# Patient Record
Sex: Male | Born: 1985 | Hispanic: Yes | Marital: Married
Health system: Southern US, Academic
[De-identification: ages and names within clinical notes are randomized; demographics above are authoritative.]

## PROBLEM LIST (undated history)

## (undated) ENCOUNTER — Ambulatory Visit

## (undated) ENCOUNTER — Telehealth

## (undated) ENCOUNTER — Ambulatory Visit: Attending: Pharmacist | Primary: Pharmacist

## (undated) ENCOUNTER — Encounter

## (undated) ENCOUNTER — Ambulatory Visit: Payer: PRIVATE HEALTH INSURANCE

## (undated) ENCOUNTER — Encounter: Attending: Physician Assistant | Primary: Physician Assistant

## (undated) ENCOUNTER — Telehealth
Attending: Student in an Organized Health Care Education/Training Program | Primary: Student in an Organized Health Care Education/Training Program

## (undated) ENCOUNTER — Ambulatory Visit: Payer: PRIVATE HEALTH INSURANCE | Attending: Physician Assistant | Primary: Physician Assistant

## (undated) ENCOUNTER — Encounter
Attending: Pharmacist Clinician (PhC)/ Clinical Pharmacy Specialist | Primary: Pharmacist Clinician (PhC)/ Clinical Pharmacy Specialist

## (undated) ENCOUNTER — Encounter
Attending: Student in an Organized Health Care Education/Training Program | Primary: Student in an Organized Health Care Education/Training Program

## (undated) ENCOUNTER — Encounter: Attending: Mental Health | Primary: Mental Health

## (undated) ENCOUNTER — Ambulatory Visit: Payer: Medicaid (Managed Care)

## (undated) ENCOUNTER — Encounter: Attending: Neurology | Primary: Neurology

## (undated) ENCOUNTER — Telehealth
Attending: Pharmacist Clinician (PhC)/ Clinical Pharmacy Specialist | Primary: Pharmacist Clinician (PhC)/ Clinical Pharmacy Specialist

## (undated) ENCOUNTER — Encounter: Attending: Obstetrics & Gynecology | Primary: Obstetrics & Gynecology

## (undated) ENCOUNTER — Telehealth: Attending: Mental Health | Primary: Mental Health

## (undated) ENCOUNTER — Ambulatory Visit
Payer: PRIVATE HEALTH INSURANCE | Attending: Pharmacist Clinician (PhC)/ Clinical Pharmacy Specialist | Primary: Pharmacist Clinician (PhC)/ Clinical Pharmacy Specialist

## (undated) ENCOUNTER — Ambulatory Visit
Payer: Medicaid (Managed Care) | Attending: Student in an Organized Health Care Education/Training Program | Primary: Student in an Organized Health Care Education/Training Program

## (undated) ENCOUNTER — Encounter: Attending: Pharmacist | Primary: Pharmacist

## (undated) HISTORY — PX: NO PAST SURGERIES: SHX2092

---

## 2005-06-04 ENCOUNTER — Emergency Department: Payer: Self-pay | Admitting: Emergency Medicine

## 2005-06-06 ENCOUNTER — Emergency Department: Payer: Self-pay | Admitting: Unknown Physician Specialty

## 2018-11-30 IMAGING — US US ABDOMEN LIMITED
1 series · 14 of 25 positions shown · non-contrast
Comparison: None.

CLINICAL DATA: Upper abdominal pain

EXAM:
ULTRASOUND ABDOMEN LIMITED RIGHT UPPER QUADRANT

[Series 1: us abdomen limited · 0.30mm/px · 14 of 39 slices shown]
[im 1/39]
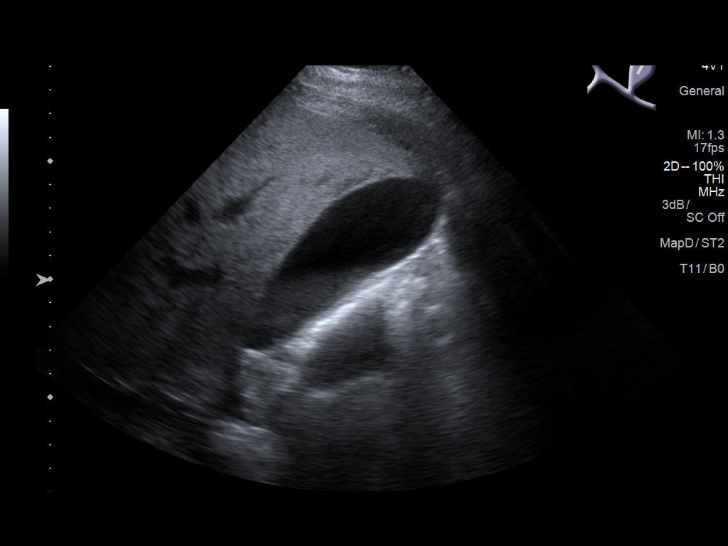
[im 4/39]
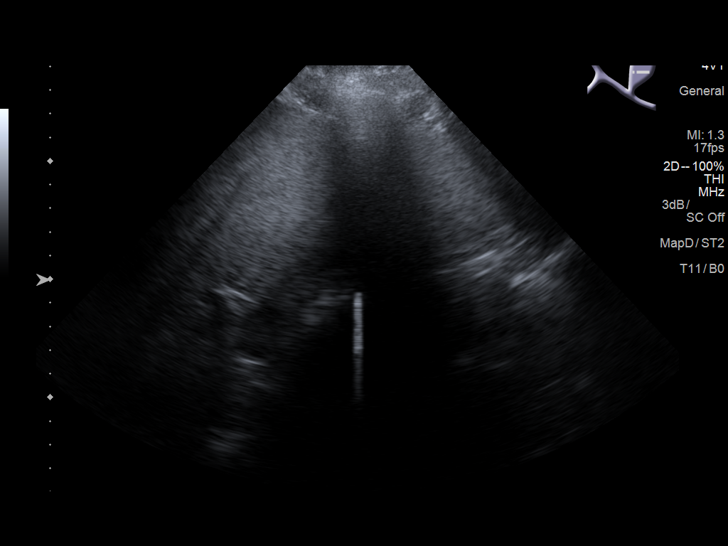
[im 7/39]
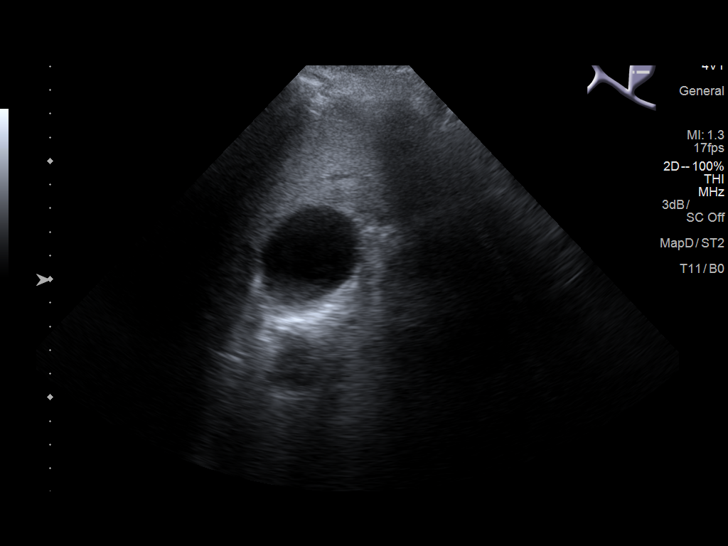
[im 10/39]
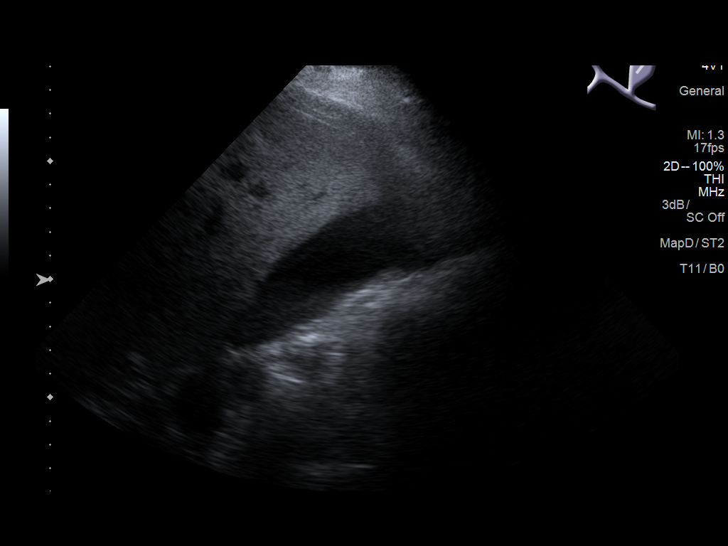
[im 13/39]
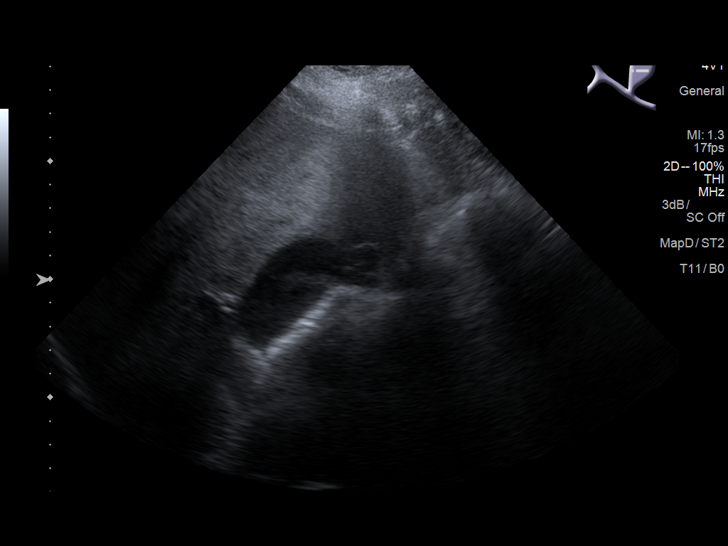
[im 15/39]
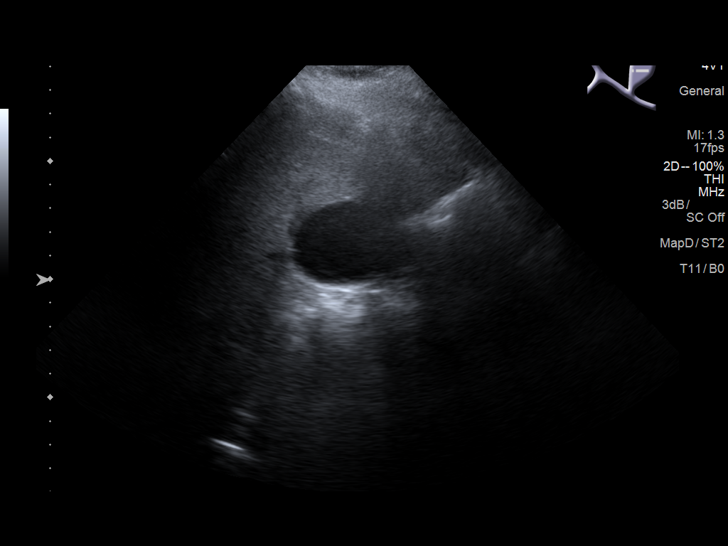
[im 18/39]
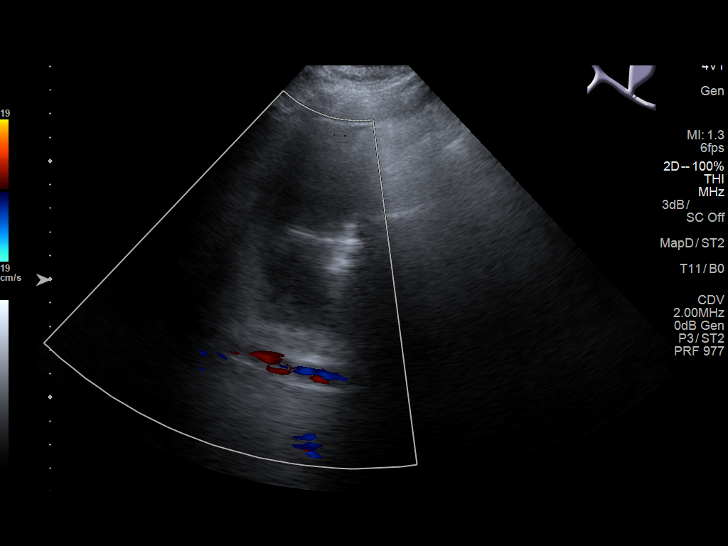
[im 21/39]
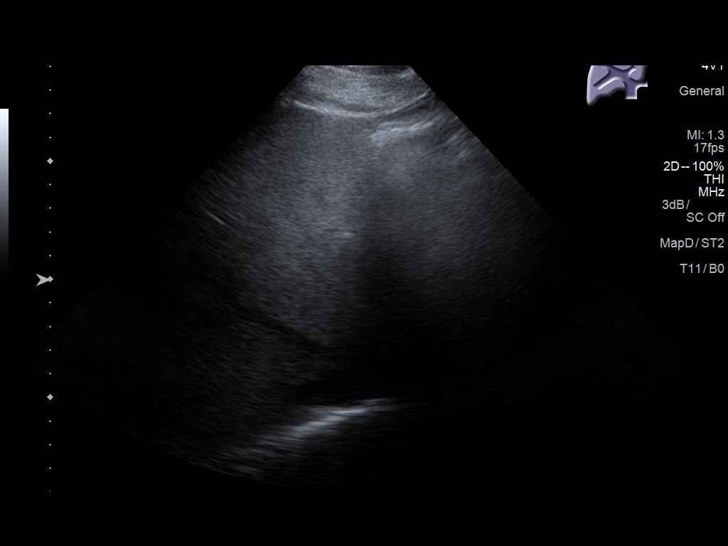
[im 24/39]
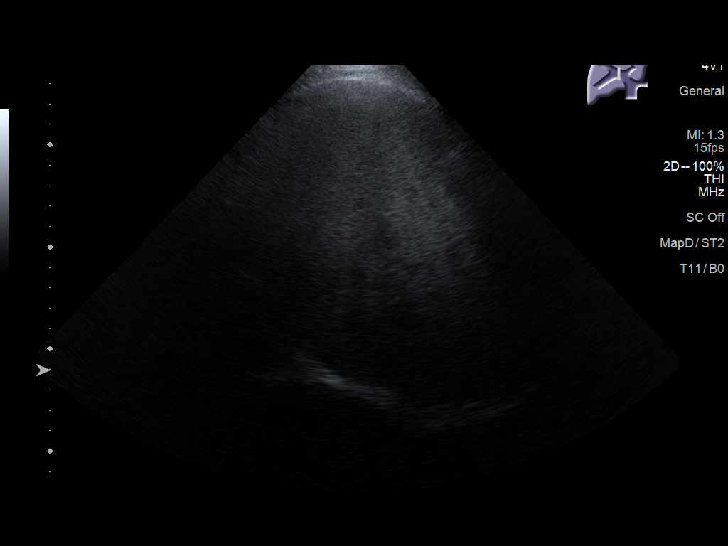
[im 26/39]
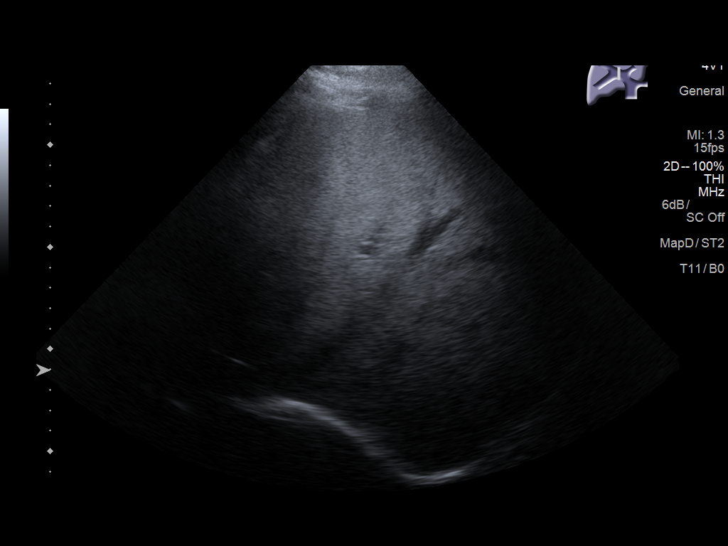
[im 29/39]
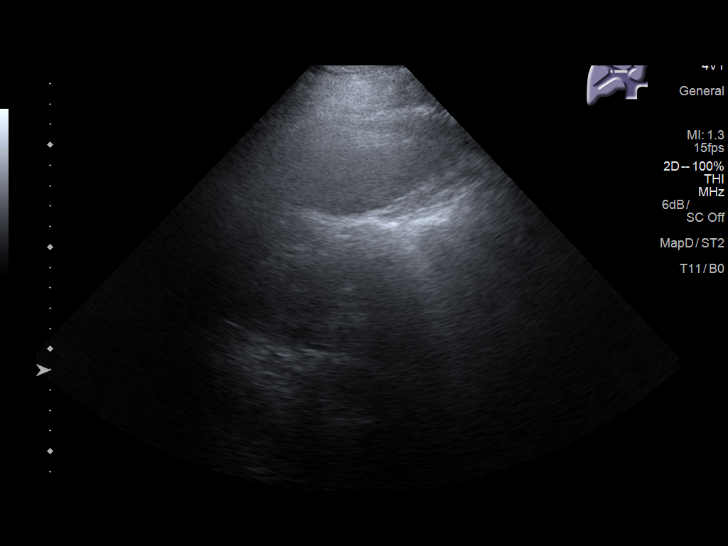
[im 32/39]
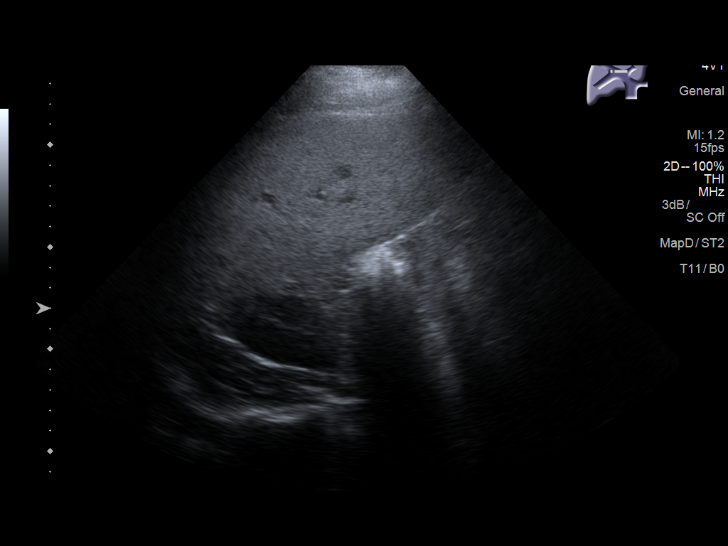
[im 35/39]
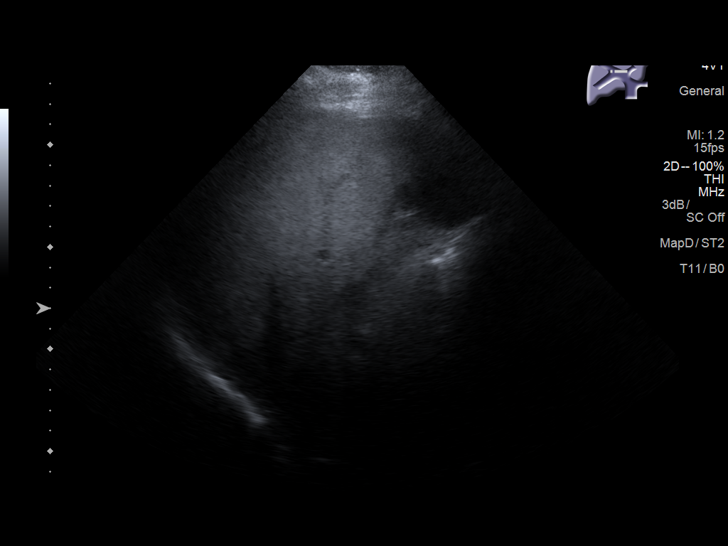
[im 39/39]
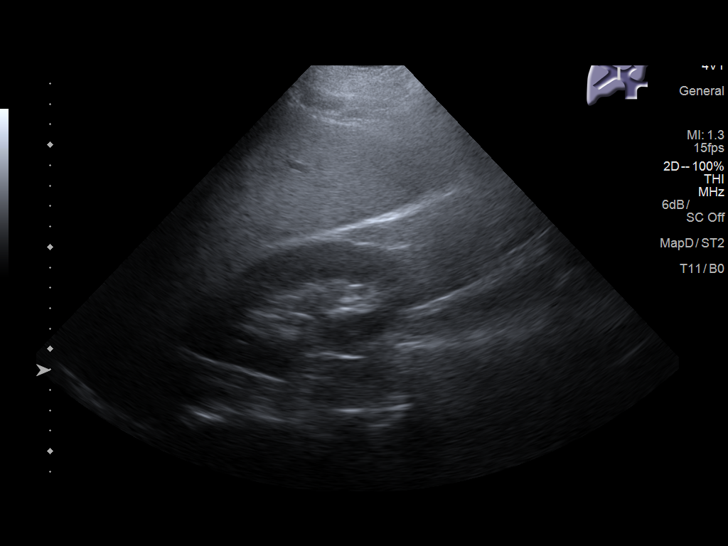

[14 of 25 positions shown; findings below may reference images not displayed]

FINDINGS: Gallbladder:

There is sludge noted in the gallbladder. No echogenic foci which
move and shadow as is expected with gallstones appreciable. There is
no gallbladder wall thickening or pericholecystic fluid. No
sonographic Murphy sign noted by sonographer.

Common bile duct:

Diameter: 6 mm. No intrahepatic or extrahepatic biliary duct
dilatation.

Liver:

No focal lesion identified. Liver echogenicity is increased
diffusely. Portal vein is patent on color Doppler imaging with
normal direction of blood flow towards the liver.
IMPRESSION: 1. Sludge in gallbladder. No gallstones, gallbladder wall
thickening, or pericholecystic fluid.

2. Increase in liver echogenicity, a finding indicative of hepatic
steatosis. While no focal liver lesions are evident on this study,
it must be cautioned that the sensitivity of ultrasound for
detection of focal liver lesions is diminished in this circumstance.

## 2019-01-16 ENCOUNTER — Encounter: Payer: Self-pay | Admitting: Emergency Medicine

## 2019-01-16 ENCOUNTER — Emergency Department
Admission: EM | Admit: 2019-01-16 | Discharge: 2019-01-16 | Disposition: A | Discharge status: Home / Self Care | Payer: 59 | Attending: Emergency Medicine | Admitting: Emergency Medicine

## 2019-01-16 ENCOUNTER — Emergency Department: Payer: 59

## 2019-01-16 DIAGNOSIS — R1010 Upper abdominal pain, unspecified: Secondary | ICD-10-CM

## 2019-01-16 DIAGNOSIS — R1011 Right upper quadrant pain: Secondary | ICD-10-CM | POA: Insufficient documentation

## 2019-01-16 LAB — CBC
HCT: 41.8 % (ref 39.0–52.0)
Hemoglobin: 14 g/dL (ref 13.0–17.0)
MCH: 30.3 pg (ref 26.0–34.0)
MCHC: 33.5 g/dL (ref 30.0–36.0)
MCV: 90.5 fL (ref 80.0–100.0)
PLATELETS: 325 10*3/uL (ref 150–400)
RBC: 4.62 MIL/uL (ref 4.22–5.81)
RDW: 11.9 % (ref 11.5–15.5)
WBC: 13.1 10*3/uL — ABNORMAL HIGH (ref 4.0–10.5)
nRBC: 0 % (ref 0.0–0.2)

## 2019-01-16 LAB — COMPREHENSIVE METABOLIC PANEL
ALT: 37 U/L (ref 0–44)
AST: 26 U/L (ref 15–41)
Albumin: 4.3 g/dL (ref 3.5–5.0)
Alkaline Phosphatase: 64 U/L (ref 38–126)
Anion gap: 7 (ref 5–15)
BUN: 11 mg/dL (ref 6–20)
CALCIUM: 8.7 mg/dL — AB (ref 8.9–10.3)
CO2: 25 mmol/L (ref 22–32)
Chloride: 108 mmol/L (ref 98–111)
Creatinine, Ser: 0.67 mg/dL (ref 0.61–1.24)
GFR calc Af Amer: >60 mL/min (ref >60)
GFR calc non Af Amer: >60 mL/min (ref >60)
Glucose, Bld: 92 mg/dL (ref 70–99)
Potassium: 3.7 mmol/L (ref 3.5–5.1)
Sodium: 140 mmol/L (ref 135–145)
Total Bilirubin: 1.1 mg/dL (ref 0.3–1.2)
Total Protein: 7.7 g/dL (ref 6.5–8.1)

## 2019-01-16 LAB — URINALYSIS, COMPLETE (UACMP) WITH MICROSCOPIC
Bacteria, UA: NONE SEEN
Bilirubin Urine: NEGATIVE
Glucose, UA: NEGATIVE mg/dL
Ketones, ur: NEGATIVE mg/dL
Leukocytes, UA: NEGATIVE
Nitrite: NEGATIVE
PH: 6 (ref 5.0–8.0)
Protein, ur: NEGATIVE mg/dL
SPECIFIC GRAVITY, URINE: 1.024 (ref 1.005–1.030)

## 2019-01-16 LAB — TROPONIN I: Troponin I: <0.03 ng/mL (ref <0.03)

## 2019-01-16 LAB — LIPASE, BLOOD: Lipase: 24 U/L (ref 11–51)

## 2019-01-16 IMAGING — CT CT ABD-PELV W/ CM
2 of 4 series · 16 of 46 positions shown, 18 images · IV contrast (APPLIED)
Comparison: Ultrasound right upper quadrant [DATE]

CLINICAL DATA: Abdominal pain, primarily right-sided

EXAM:
CT ABDOMEN AND PELVIS WITH CONTRAST
TECHNIQUE: Multidetector CT imaging of the abdomen and pelvis was performed
using the standard protocol following bolus administration of
intravenous contrast.
CONTRAST:  100mL [HA] IOPAMIDOL ([HA]) INJECTION 61%

[Series 2: routine abd/pel with · axial · 0.85mm/px · z∈[-212,+293]mm · 13 of 111 slices shown, 15 images]
[im 5/111  soft-tissue]
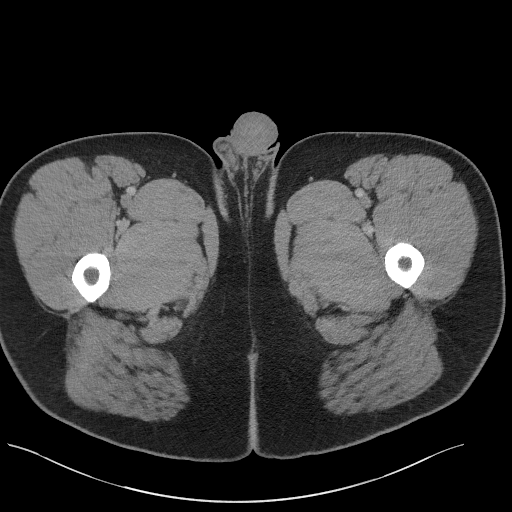
[im 5/111  bone]
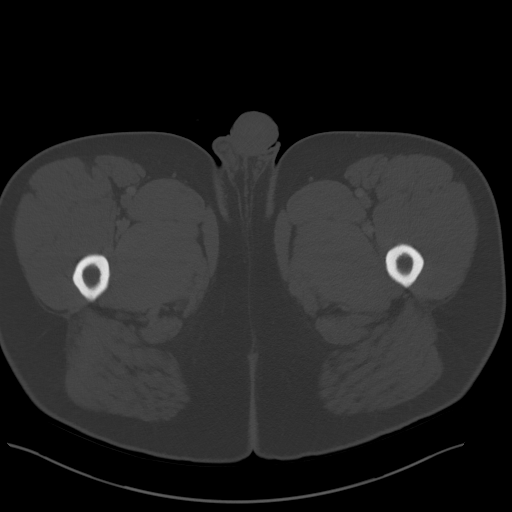
[im 15/111  soft-tissue]
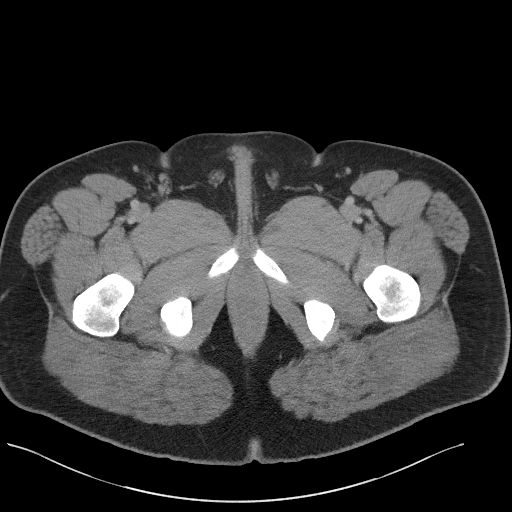
[im 24/111  soft-tissue]
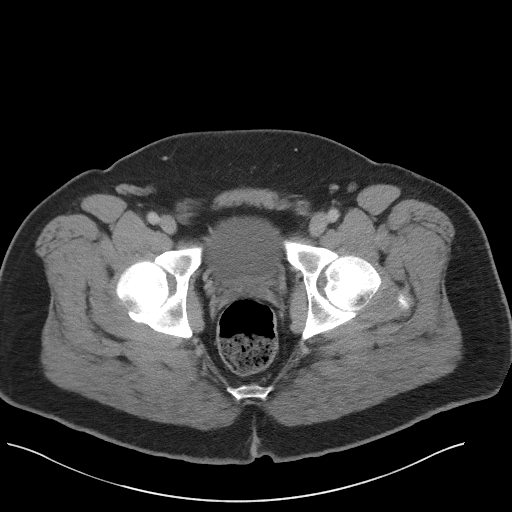
[im 29/111  soft-tissue]
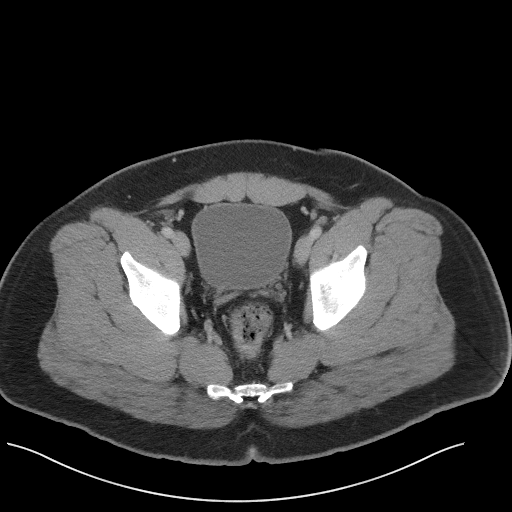
[im 39/111  soft-tissue]
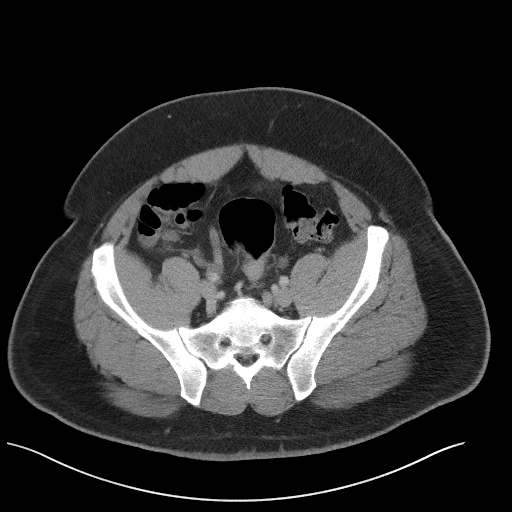
[im 48/111  soft-tissue]
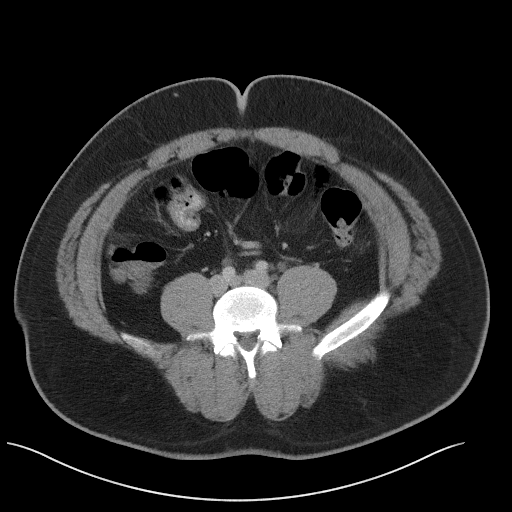
[im 58/111  soft-tissue]
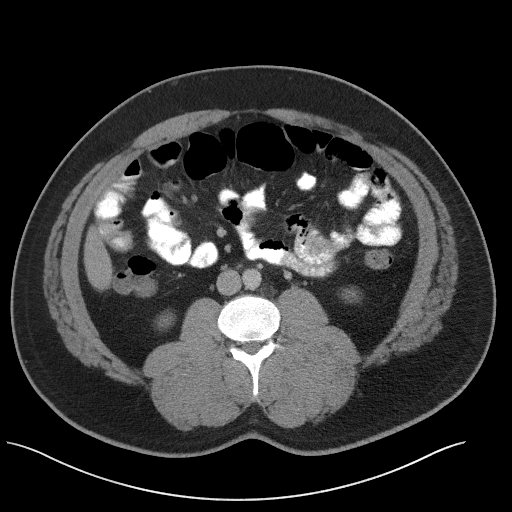
[im 63/111  soft-tissue]
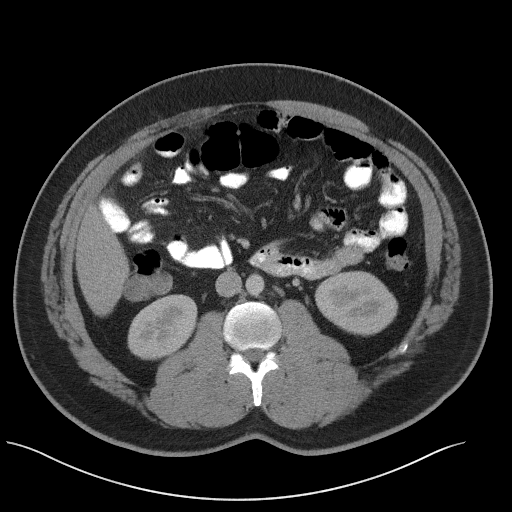
[im 72/111  soft-tissue]
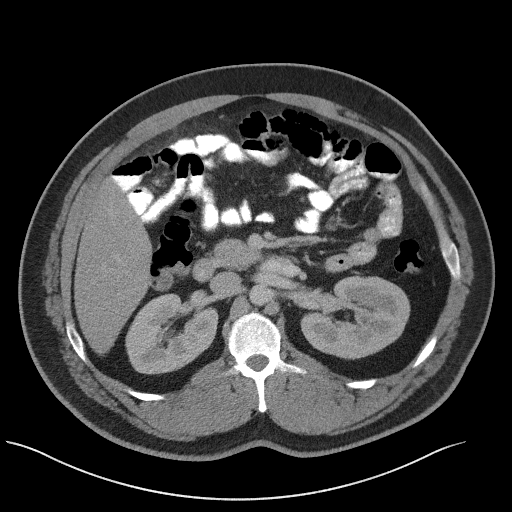
[im 72/111  bone]
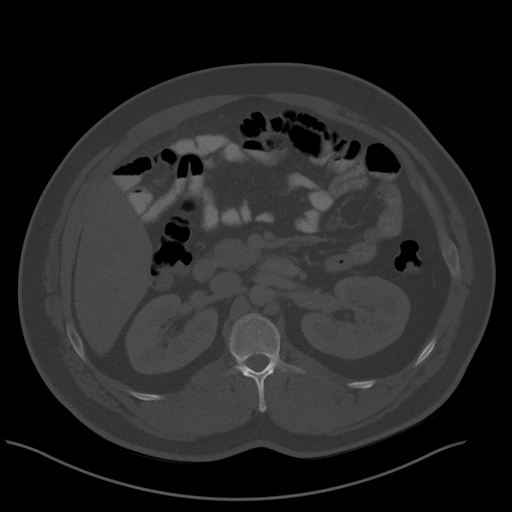
[im 82/111  soft-tissue]
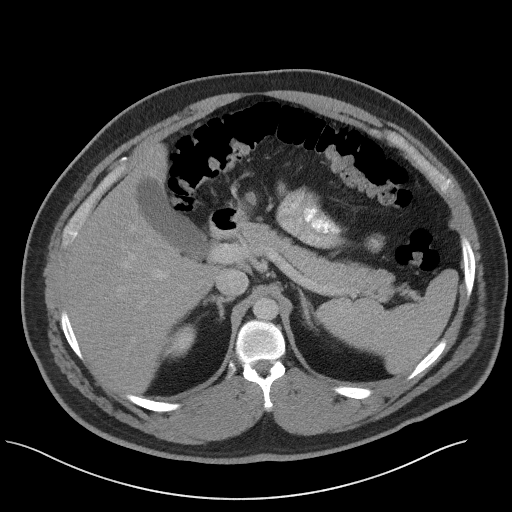
[im 87/111  soft-tissue]
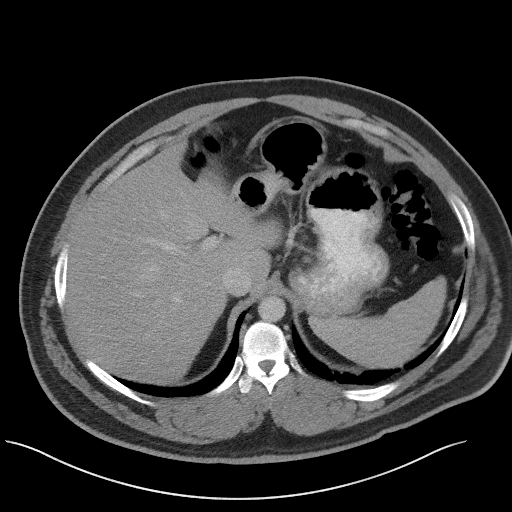
[im 96/111  soft-tissue]
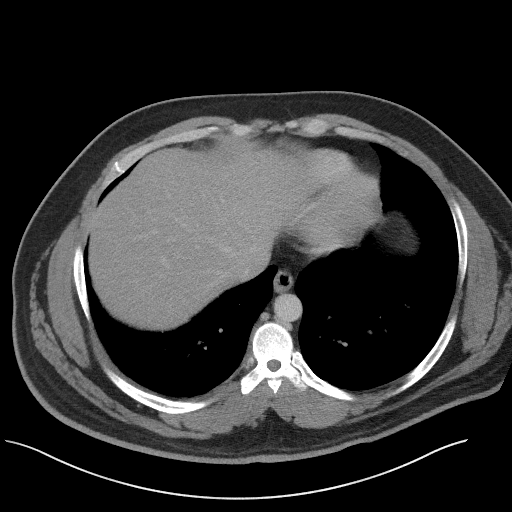
[im 106/111  soft-tissue]
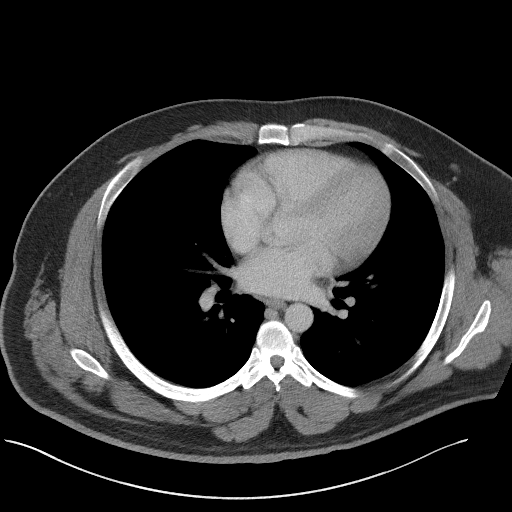

[Series 5: coronal st · coronal · 0.93mm/px · 3 of 106 slices shown]
[im 36/106  soft-tissue]
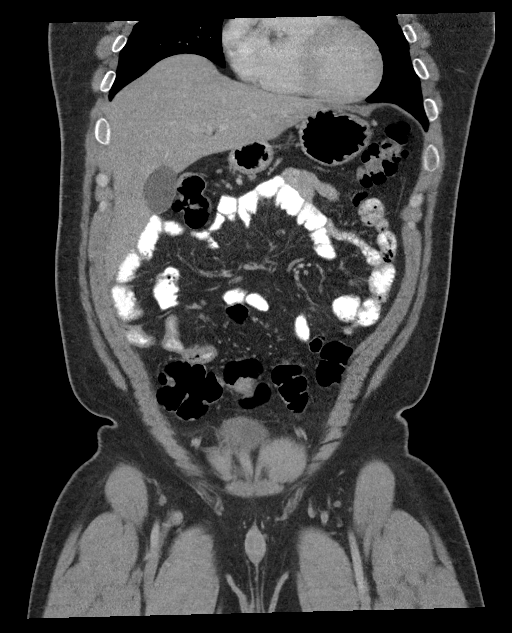
[im 47/106  soft-tissue]
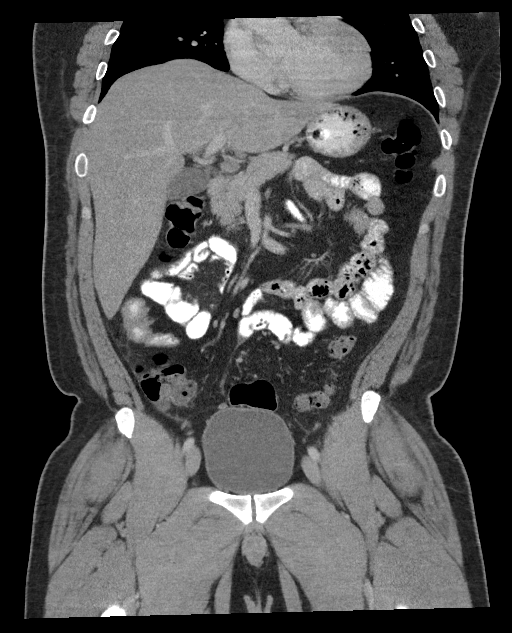
[im 59/106  soft-tissue]
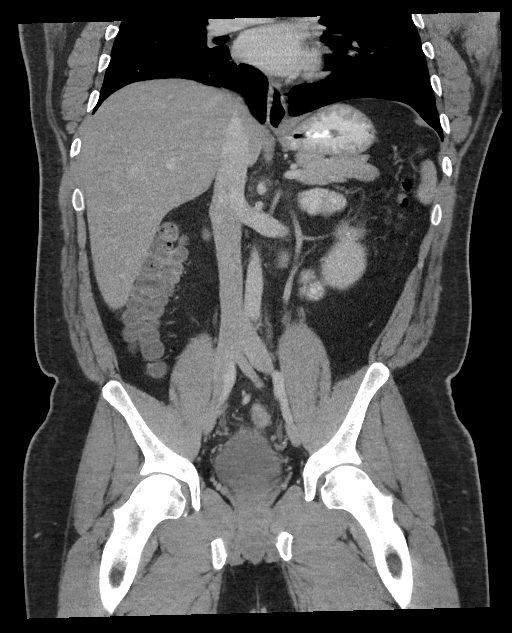

[16 of 46 positions shown; findings below may reference images not displayed]

FINDINGS: Lower chest: Lung bases are clear.

Hepatobiliary: There is hepatic steatosis. No focal liver lesions
are appreciable. Gallbladder wall is not appreciably thickened.
There is no biliary duct dilatation.

Pancreas: No pancreatic mass or inflammatory focus.

Spleen: No splenic lesions are evident.

Adrenals/Urinary Tract: Adrenals bilaterally appear unremarkable.
Kidneys bilaterally show no evident mass or hydronephrosis on either
side. There is no appreciable renal or ureteral calculus on either
side. Urinary bladder is midline with wall thickness within normal
limits.

Stomach/Bowel: There is no appreciable bowel wall or mesenteric
thickening. There is no evident bowel obstruction. There is no free
air or portal venous air.

Vascular/Lymphatic: There is no abdominal aortic aneurysm. No
vascular lesions are evident. There is no adenopathy by size
criteria in the abdomen or pelvis. There are subcentimeter inguinal
lymph nodes considered nonspecific. There are scattered
subcentimeter mesenteric lymph nodes, also regarded as nonspecific.

Reproductive: Prostate and seminal vesicles appear normal in size
and contour. No evident pelvic mass.

Other: Appendix appears normal. No evident abscess or ascites in the
abdomen or pelvis.

Musculoskeletal: There are no blastic or lytic bone lesions. There
is no intramuscular or abdominal wall lesion.
IMPRESSION: 1. No demonstrable bowel wall thickening or bowel obstruction. No
abscess in the abdomen or pelvis. Appendix appears normal.

2.  No evident renal or ureteral calculus.  No hydronephrosis.

3. Gallbladder wall does not appear appreciably thickened. There is
hepatic steatosis evident.

4. Scattered subcentimeter mesenteric lymph nodes, considered
nonspecific. These lymph nodes potentially could indicate a degree
of mesenteric adenitis in the appropriate clinical setting.

## 2019-01-16 MED ORDER — IOPAMIDOL (ISOVUE-300) INJECTION 61%
100.0000 mL | Freq: Once | INTRAVENOUS | Status: AC | PRN
  Administered 2019-01-16: 100 mL via INTRAVENOUS

## 2019-01-16 MED ORDER — TRAMADOL HCL 50 MG PO TABS: 50.0000 mg | ORAL_TABLET | Freq: Four times a day (QID) | ORAL | 0 refills | Status: AC | PRN

## 2019-01-16 MED ORDER — IOPAMIDOL (ISOVUE-300) INJECTION 61%
30.0000 mL | Freq: Once | INTRAVENOUS | Status: AC | PRN
  Administered 2019-01-16: 30 mL via ORAL

## 2019-01-16 MED ORDER — SODIUM CHLORIDE 0.9% FLUSH: 3.0000 mL | Freq: Once | INTRAVENOUS | Status: DC

## 2019-01-16 MED ORDER — KETOROLAC TROMETHAMINE 30 MG/ML IJ SOLN
30.0000 mg | Freq: Once | INTRAMUSCULAR | Status: AC
  Administered 2019-01-16: 30 mg via INTRAVENOUS
  Filled 2019-01-16: qty 1

## 2019-01-16 NOTE — ED Provider Notes (Signed)
[facility] Emergency Department Provider Note   ____________________________________________    I have reviewed the triage vital signs and the nursing notes.   HISTORY  Chief Complaint Abdominal Pain     HPI Trevor Boone is a 33 y.o. male who presents with complaints of abdominal pain.  Patient reports abdominal pain x4 days primarily in the right upper quadrant, it has been mild but constant until this morning when it became more severe.  He denies a history of abdominal surgery.  No fevers or chills.  No nausea or vomiting.  Has not take anything for this.  Pain does not radiate.   History reviewed. No pertinent past medical history.  There are no active problems to display for this patient.   History reviewed. No pertinent surgical history.  Prior to Admission medications   Medication Sig Start Date End Date Taking? Authorizing Provider  traMADol (ULTRAM) 50 MG tablet Take 1 tablet (50 mg total) by mouth every 6 (six) hours as needed. 01/16/19 01/16/20  Jene Every, MD     Allergies Patient has no known allergies.  History reviewed. No pertinent family history.  Social History Social History   Tobacco Use  . Smoking status: Never Smoker  . Smokeless tobacco: Never Used  Substance Use Topics  . Alcohol use: Not on file  . Drug use: Not on file    Review of Systems  Constitutional: No fever/chills Eyes: No visual changes.  ENT: No sore throat. Cardiovascular: Denies chest pain.  No pleurisy Respiratory: Denies shortness of breath. Gastrointestinal: As above Genitourinary: Negative for dysuria. Musculoskeletal: Negative for back pain. Skin: Negative for rash. Neurological: Negative for headaches or weakness   ____________________________________________   PHYSICAL EXAM:  VITAL SIGNS: ED Triage Vitals  Enc Vitals Group     BP 01/16/19 0253 137/87     Pulse Rate 01/16/19 0253 90     Resp 01/16/19 0253 18     Temp  01/16/19 0253 97.9 F (36.6 C)     Temp Source 01/16/19 0253 Oral     SpO2 01/16/19 0253 96 %     Weight 01/16/19 0253 117.9 kg (260 lb)     Height 01/16/19 0253 1.676 m (5\' 6" )     Head Circumference --      Peak Flow --      Pain Score 01/16/19 0605 6     Pain Loc --      Pain Edu? --      Excl. in GC? --     Constitutional: Alert and oriented. No acute distress.  Eyes: Conjunctivae are normal.   Nose: No congestion/rhinnorhea. Mouth/Throat: Mucous membranes are moist.    Cardiovascular: Normal rate, regular rhythm. Grossly normal heart sounds.  Good peripheral circulation. Respiratory: Normal respiratory effort.  No retractions. Lungs CTAB. Gastrointestinal: Significant tenderness right upper quadrant . No distention.  No CVA tenderness.  Musculoskeletal:   Warm and well perfused Neurologic:  Normal speech and language. No gross focal neurologic deficits are appreciated.  Skin:  Skin is warm, dry and intact. No rash noted. Psychiatric: Mood and affect are normal. Speech and behavior are normal.  ____________________________________________   LABS (all labs ordered are listed, but only abnormal results are displayed)  Labs Reviewed  COMPREHENSIVE METABOLIC PANEL - Abnormal; Notable for the following components:      Result Value   Calcium 8.7 (*)    All other components within normal limits  CBC - Abnormal; Notable for the following  components:   WBC 13.1 (*)    All other components within normal limits  URINALYSIS, COMPLETE (UACMP) WITH MICROSCOPIC - Abnormal; Notable for the following components:   Color, Urine YELLOW (*)    APPearance CLEAR (*)    Hgb urine dipstick SMALL (*)    All other components within normal limits  LIPASE, BLOOD  TROPONIN I   ____________________________________________  EKG  ED ECG REPORT I, Jene Every, the attending physician, personally viewed and interpreted this ECG.  Date: 01/16/2019  Rhythm: normal sinus rhythm QRS  Axis: normal Intervals: normal ST/T Wave abnormalities: normal Narrative Interpretation: no evidence of acute ischemia  ____________________________________________  RADIOLOGY  Ultrasound ____________________________________________   PROCEDURES  Procedure(s) performed: No  Procedures   Critical Care performed: No ____________________________________________   INITIAL IMPRESSION / ASSESSMENT AND PLAN / ED COURSE  Pertinent labs & imaging results that were available during my care of the patient were reviewed by me and considered in my medical decision making (see chart for details).  Patient with tenderness in the right upper quadrant, suspicious for cholelithiasis/cholecystitis.  LFTs are reassuring, lab work is unremarkable except for a mildly elevated white blood cell count.  We will obtain ultrasound give IV Toradol and reevaluate  Patient with improvement after treatment.  Ultrasound negative for cholecystitis.  CT abdomen pelvis obtained also unremarkable, discussed findings of hepatic steatosis with the patient need for outpatient follow-up.  Given reassuring labs imaging and improvement in his condition will discharge with analgesics, strict return precautions, outpatient follow-up with GI    ____________________________________________   FINAL CLINICAL IMPRESSION(S) / ED DIAGNOSES  Final diagnoses:  Upper abdominal pain  Right upper quadrant abdominal pain        Note:  This document was prepared using Dragon voice recognition software and may include unintentional dictation errors.   Jene Every, MD 01/16/19 1003

## 2019-01-16 NOTE — ED Triage Notes (Signed)
Pt c/o RUQ abdominal pain x4 days. Worse pain when sitting and taking deep breath. Pt denies injury as well as chest pain and SOB

## 2019-01-16 NOTE — ED Notes (Signed)
Pt states upper right abdominal pain that started on Monday morning that has been getting gradually worse. Pt also states that pain is constant.  Pt is alert and oriented x 4. Family at bedside.

## 2019-01-20 ENCOUNTER — Other Ambulatory Visit: Payer: Self-pay | Admitting: Student

## 2019-01-20 DIAGNOSIS — R1011 Right upper quadrant pain: Secondary | ICD-10-CM

## 2019-01-21 ENCOUNTER — Other Ambulatory Visit: Payer: Self-pay | Admitting: Student

## 2019-01-21 DIAGNOSIS — R1011 Right upper quadrant pain: Secondary | ICD-10-CM

## 2019-01-21 DIAGNOSIS — K828 Other specified diseases of gallbladder: Secondary | ICD-10-CM

## 2019-01-27 ENCOUNTER — Ambulatory Visit
Admission: RE | Admit: 2019-01-27 | Discharge: 2019-01-27 | Disposition: A | Discharge status: Home / Self Care | Payer: 59 | Source: Ambulatory Visit | Attending: Student | Admitting: Student

## 2019-01-27 DIAGNOSIS — K828 Other specified diseases of gallbladder: Secondary | ICD-10-CM

## 2019-01-27 DIAGNOSIS — R1011 Right upper quadrant pain: Secondary | ICD-10-CM | POA: Insufficient documentation

## 2019-01-27 IMAGING — NM NM HEPATO W/GB/PHARM/[PERSON_NAME]
2 series · 12 of 12 positions shown · non-contrast
Comparison: None.

CLINICAL DATA: Upper abdominal pain

EXAM:
NUCLEAR MEDICINE HEPATOBILIARY IMAGING WITH GALLBLADDER EF
VIEWS:
Anterior right upper quadrant
RADIOPHARMACEUTICALS:  5.34 mCi [KO]  Choletec IV

[Series 1000: hepatobiliary scan · 9.59mm/px · 6 of 60 frames shown]
[frame 6/60]
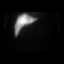
[frame 16/60]
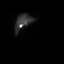
[frame 26/60]
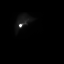
[frame 36/60]
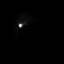
[frame 46/60]
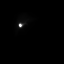
[frame 56/60]
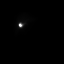

[Series 1000: gallbladder ef · 4.80mm/px · 6 of 120 frames shown]
[frame 11/120]
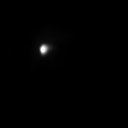
[frame 31/120]
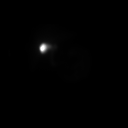
[frame 51/120]
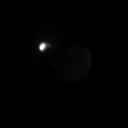
[frame 71/120]
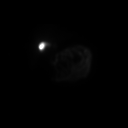
[frame 91/120]
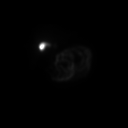
[frame 111/120]
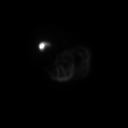

[12 of 12 positions shown; findings below may reference images not displayed]

FINDINGS: Liver uptake of radiotracer is unremarkable. There is prompt
visualization of gallbladder and small bowel, indicating patency of
the cystic and common bile ducts. The patient consumed 8 ounces of
Ensure orally with calculation of the computer generated ejection
fraction of radiotracer from the gallbladder. Patient did not
experience clinical symptoms with the oral Ensure consumption. The
computer generated ejection fraction of radiotracer from the
gallbladder is normal at 50%, normal greater than 33% using the oral
agent.
IMPRESSION: Study within normal limits.

## 2019-01-27 MED ORDER — TECHNETIUM TC 99M MEBROFENIN IV KIT
5.3400 | PACK | Freq: Once | INTRAVENOUS | Status: AC | PRN
  Administered 2019-01-27: 5.34 via INTRAVENOUS

## 2021-09-14 DIAGNOSIS — Z8279 Family history of other congenital malformations, deformations and chromosomal abnormalities: Principal | ICD-10-CM

## 2021-09-18 ENCOUNTER — Ambulatory Visit: Admit: 2021-09-18 | Discharge: 2021-09-19

## 2021-11-09 DIAGNOSIS — G709 Myoneural disorder, unspecified: Secondary | ICD-10-CM

## 2021-11-09 HISTORY — DX: Myoneural disorder, unspecified: G70.9

## 2021-11-28 ENCOUNTER — Encounter: Payer: Self-pay | Admitting: Family Medicine

## 2021-11-28 ENCOUNTER — Other Ambulatory Visit: Payer: Self-pay

## 2021-11-28 ENCOUNTER — Observation Stay: Payer: BC Managed Care – PPO

## 2021-11-28 ENCOUNTER — Emergency Department: Payer: BC Managed Care – PPO

## 2021-11-28 ENCOUNTER — Inpatient Hospital Stay
Admission: EM | Admit: 2021-11-28 | Discharge: 2021-12-02 | DRG: 058 | Disposition: A | Discharge status: Home / Self Care | Payer: BC Managed Care – PPO | Attending: Internal Medicine | Admitting: Internal Medicine

## 2021-11-28 DIAGNOSIS — R739 Hyperglycemia, unspecified: Secondary | ICD-10-CM | POA: Diagnosis present

## 2021-11-28 DIAGNOSIS — H5121 Internuclear ophthalmoplegia, right eye: Secondary | ICD-10-CM

## 2021-11-28 DIAGNOSIS — G35 Multiple sclerosis: Secondary | ICD-10-CM | POA: Diagnosis not present

## 2021-11-28 DIAGNOSIS — R42 Dizziness and giddiness: Secondary | ICD-10-CM | POA: Diagnosis not present

## 2021-11-28 DIAGNOSIS — F17210 Nicotine dependence, cigarettes, uncomplicated: Secondary | ICD-10-CM | POA: Diagnosis present

## 2021-11-28 DIAGNOSIS — U071 COVID-19: Secondary | ICD-10-CM | POA: Diagnosis present

## 2021-11-28 DIAGNOSIS — G35D Multiple sclerosis, unspecified: Secondary | ICD-10-CM

## 2021-11-28 DIAGNOSIS — T380X5A Adverse effect of glucocorticoids and synthetic analogues, initial encounter: Secondary | ICD-10-CM | POA: Diagnosis present

## 2021-11-28 DIAGNOSIS — Z6839 Body mass index (BMI) 39.0-39.9, adult: Secondary | ICD-10-CM

## 2021-11-28 DIAGNOSIS — H5509 Other forms of nystagmus: Secondary | ICD-10-CM | POA: Diagnosis present

## 2021-11-28 LAB — URINALYSIS, ROUTINE W REFLEX MICROSCOPIC
Bacteria, UA: NONE SEEN
Bilirubin Urine: NEGATIVE
Glucose, UA: NEGATIVE mg/dL
Ketones, ur: NEGATIVE mg/dL
Leukocytes,Ua: NEGATIVE
Nitrite: NEGATIVE
Protein, ur: NEGATIVE mg/dL
Specific Gravity, Urine: 1.015 (ref 1.005–1.030)
Squamous Epithelial / HPF: NONE SEEN (ref 0–5)
pH: 6 (ref 5.0–8.0)

## 2021-11-28 LAB — BASIC METABOLIC PANEL
Anion gap: 8 (ref 5–15)
BUN: 9 mg/dL (ref 6–20)
CO2: 25 mmol/L (ref 22–32)
Calcium: 9.5 mg/dL (ref 8.9–10.3)
Chloride: 107 mmol/L (ref 98–111)
Creatinine, Ser: 0.69 mg/dL (ref 0.61–1.24)
GFR, Estimated: >60 mL/min (ref >60)
Glucose, Bld: 90 mg/dL (ref 70–99)
Potassium: 3.5 mmol/L (ref 3.5–5.1)
Sodium: 140 mmol/L (ref 135–145)

## 2021-11-28 LAB — RESP PANEL BY RT-PCR (FLU A&B, COVID) ARPGX2
Influenza A by PCR: NEGATIVE
Influenza B by PCR: NEGATIVE
SARS Coronavirus 2 by RT PCR: POSITIVE — AB

## 2021-11-28 LAB — CBC
HCT: 40.5 % (ref 39.0–52.0)
Hemoglobin: 14.4 g/dL (ref 13.0–17.0)
MCH: 31.9 pg (ref 26.0–34.0)
MCHC: 35.6 g/dL (ref 30.0–36.0)
MCV: 89.8 fL (ref 80.0–100.0)
Platelets: 317 10*3/uL (ref 150–400)
RBC: 4.51 MIL/uL (ref 4.22–5.81)
RDW: 12 % (ref 11.5–15.5)
WBC: 11.8 10*3/uL — ABNORMAL HIGH (ref 4.0–10.5)
nRBC: 0 % (ref 0.0–0.2)

## 2021-11-28 LAB — GLUCOSE, CAPILLARY: Glucose-Capillary: 82 mg/dL (ref 70–99)

## 2021-11-28 IMAGING — MR MR ORBITS WO/W CM
4 of 6 series · 28 of 48 positions shown · IV contrast (10ml Gadavist)
Comparison: Comparison made with prior head CT from earlier the
same day.

CLINICAL DATA: Initial evaluation for possible demyelinating
disease. Several week history of visual disturbance, balance
difficulty.

EXAM:
MRI HEAD AND ORBITS WITHOUT AND WITH CONTRAST
TECHNIQUE: Multiplanar, multiecho pulse sequences of the brain and surrounding
structures were obtained without and with intravenous contrast.
Multiplanar, multiecho pulse sequences of the orbits and surrounding
structures were obtained including fat saturation techniques, before
and after intravenous contrast administration.
CONTRAST:  10mL GADAVIST GADOBUTROL 1 MMOL/ML IV SOLN

[Series 2: T2 · axial · 3.0mm · 0.78mm/px · z∈[-77,-13]mm · 5 of 17 slices shown (1 of 2)]
[im 1/17]
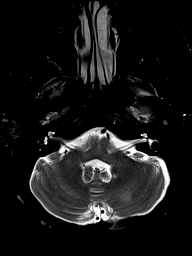
[im 5/17]
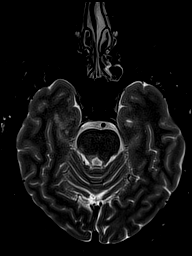
[im 9/17]
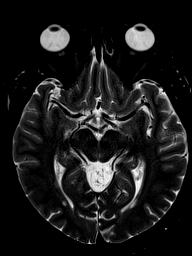
[im 13/17]
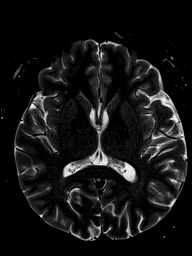
[im 17/17]
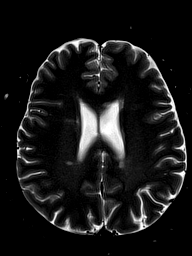

[Series 4: T2 · coronal · 3.0mm · 0.78mm/px · 9 of 35 slices shown (2 of 2)]
[im 1/35]
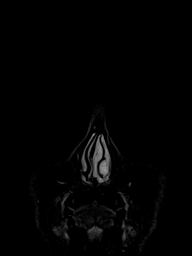
[im 5/35]
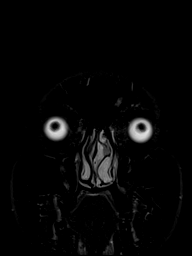
[im 9/35]
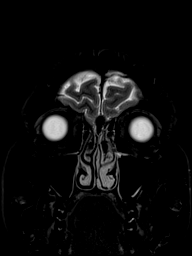
[im 13/35]
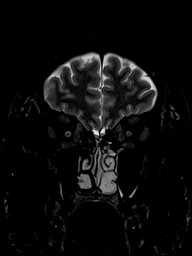
[im 18/35]
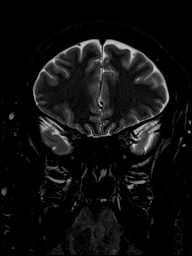
[im 22/35]
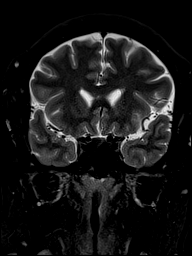
[im 26/35]
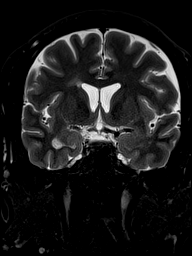
[im 30/35]
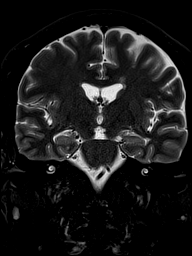
[im 35/35]
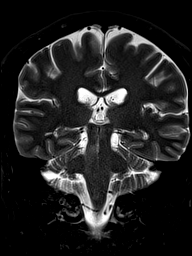

[Series 5: T1 · axial · non-contrast · 3.0mm · 0.31mm/px · z∈[-77,-12]mm · 6 of 23 slices shown (1 of 2)]
[im 1/23]
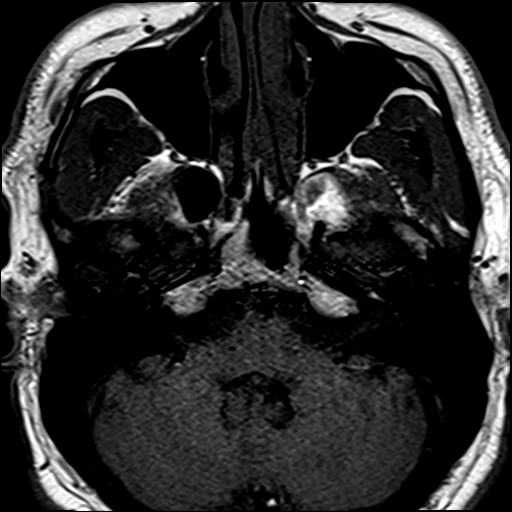
[im 5/23]
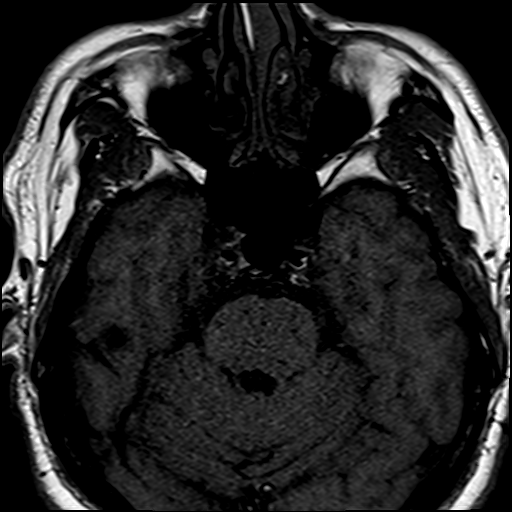
[im 9/23]
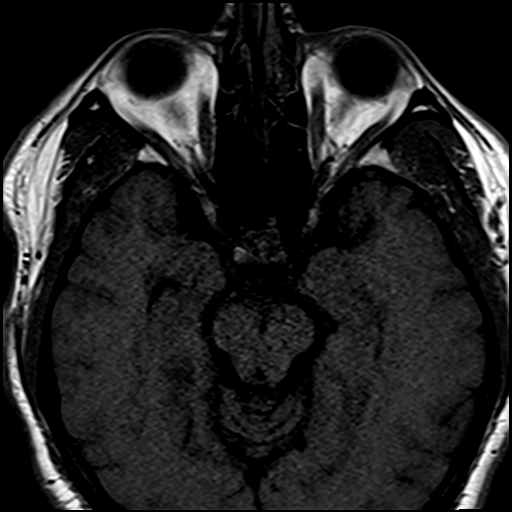
[im 14/23]
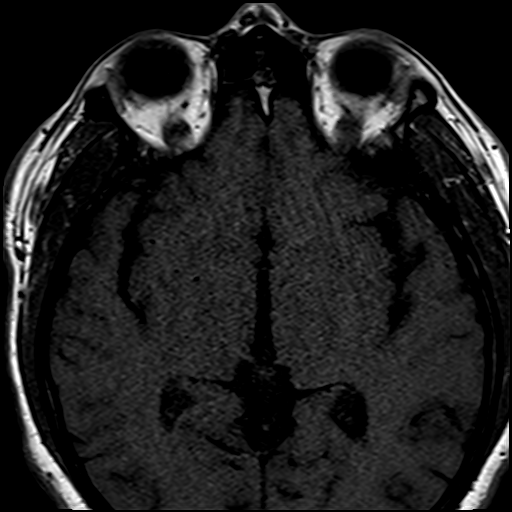
[im 18/23]
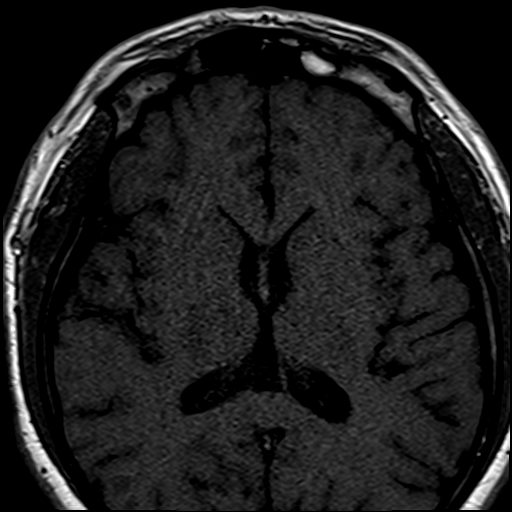
[im 23/23]
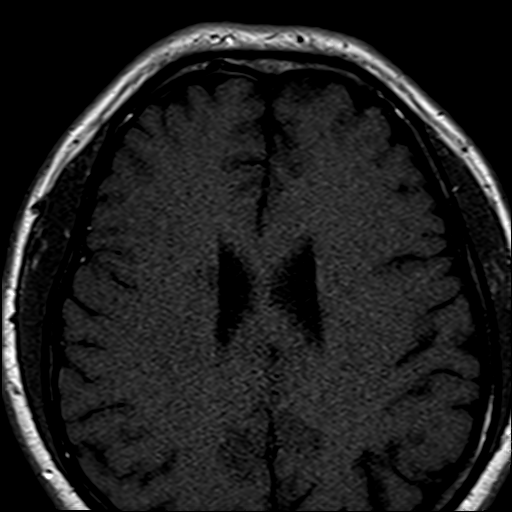

[Series 6: T1 · coronal · non-contrast · 3.0mm · 0.35mm/px · 8 of 40 slices shown (2 of 2)]
[im 1/40]
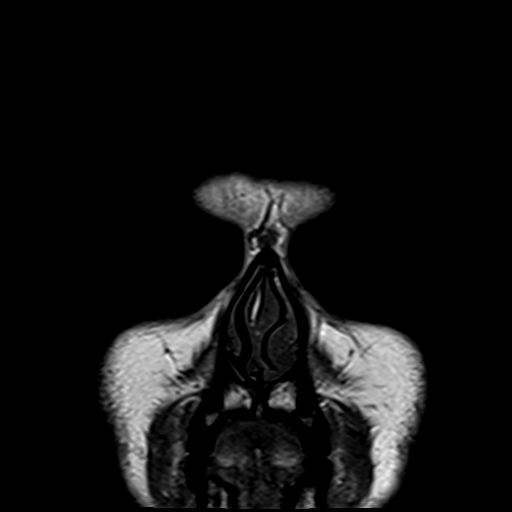
[im 4/40]
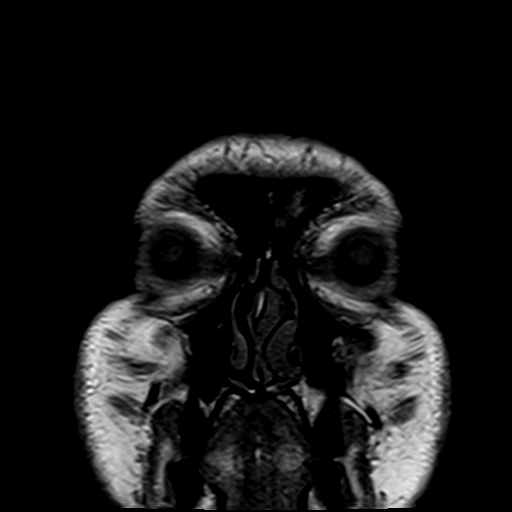
[im 8/40]
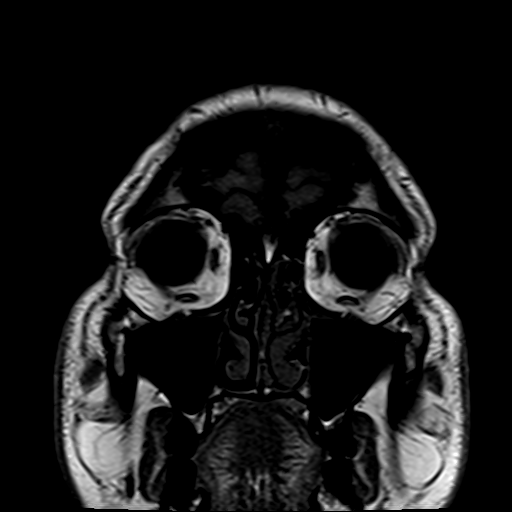
[im 12/40]
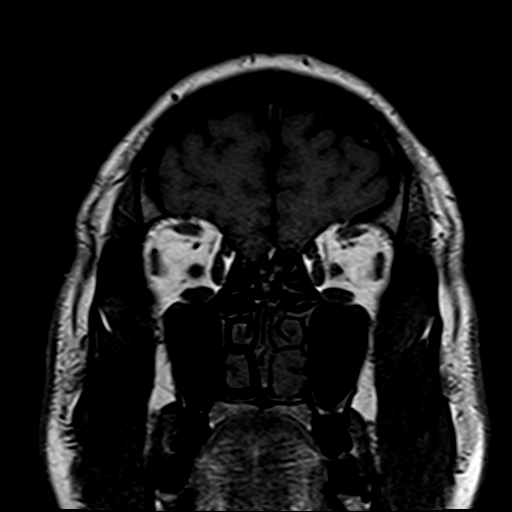
[im 16/40]
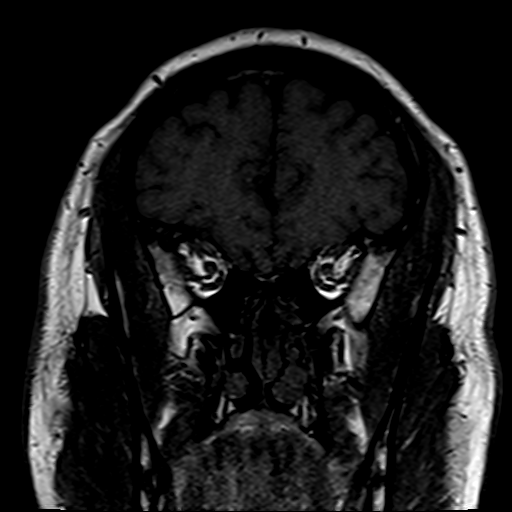
[im 20/40]
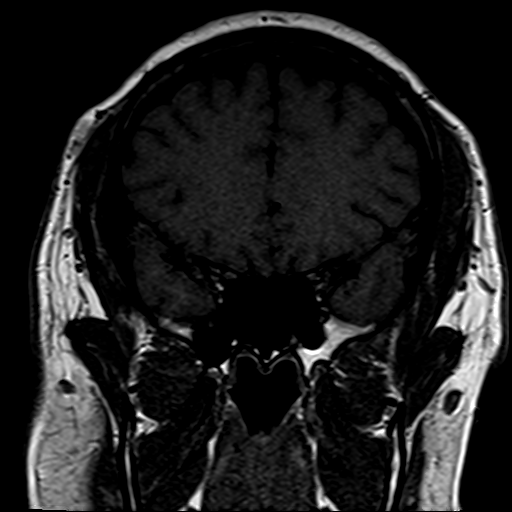
[im 24/40]
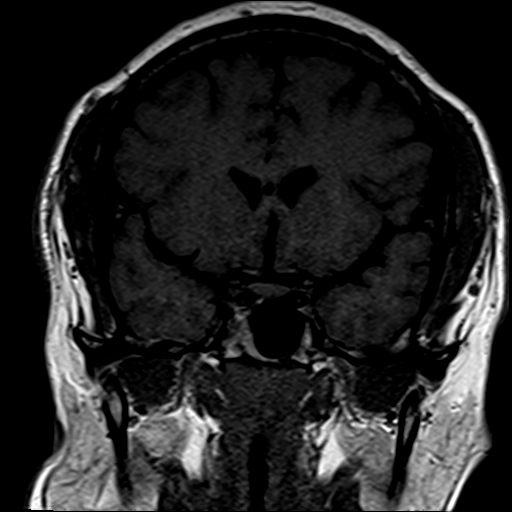
[im 36/40]
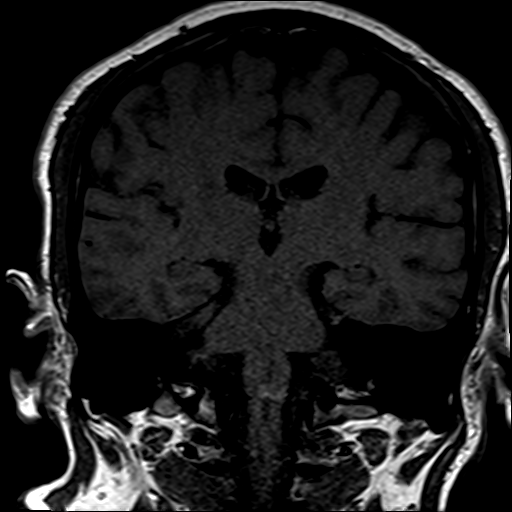

[28 of 48 positions shown; findings below may reference images not displayed]

FINDINGS: MRI HEAD FINDINGS

Brain: Cerebral volume within normal limits. Multiple scattered
patchy foci of T2/FLAIR hyperintensity are seen involving the
periventricular, deep, and juxta cortical white matter both cerebral
hemispheres. Several of these foci are oriented perpendicular to the
lateral ventricles. Scattered callososeptal involvement noted.
Patchy infratentorial involvement noted as well, with signal changes
present about the right dorsal pons and cerebellum. Multiple
corresponding T1 black holes. Overall appearance is highly
characteristic of demyelinating disease/multiple sclerosis. Single 6
mm focus of enhancement about a lesion involving the posterior left
periventricular/periatrial white matter at the left temporal
occipital region is seen, consistent with a focus of active
demyelination (series 20, image 10). No other abnormal enhancement.

No evidence for acute or subacute infarct. Gray-white matter
differentiation maintained. No areas of chronic cortical infarction.
No evidence for acute or chronic intracranial hemorrhage.

No mass lesion, mass effect or midline shift. No hydrocephalus or
extra-axial fluid collection. Pituitary gland suprasellar region
normal. Midline structures intact. No other abnormal enhancement.

Vascular: Major intracranial vascular flow voids are well
maintained.

Skull and upper cervical spine: Craniocervical junction within
normal limits. Bone marrow signal intensity normal. No scalp soft
tissue abnormality.

Other: No mastoid effusion.  Inner ear structures grossly normal.

MRI ORBITS FINDINGS

Orbits: Globes are symmetric in size with normal appearance and
morphology bilaterally. Right optic nerve is normal in appearance
without intrinsic edema or enhancement. No abnormality about the
right optic nerve sheath. On the left, there is subtle irregularity
and probable edema involving the left optic nerve just posterior to
the left globe (series 4, images 24, 23). Suggestion of associated
hazy enhancement within this region (series 8, image 28). Given the
patient's symptoms as well as the findings on corresponding brain
MRI, findings are highly suspicious for possible optic neuritis.

Remainder of the orbital soft tissues within normal limits.
Extra-ocular muscles symmetric and normal. Lacrimal glands normal.
Intraconal and extraconal fat well-maintained. No abnormality about
the orbital apices or cavernous sinus. Optic chiasm normally
situated within the suprasellar cistern.

Visualized sinuses: Small mucous retention cyst present at the left
frontal sinus. Scattered mucosal thickening noted within the
ethmoidal air cells and maxillary sinuses. Visualized paranasal
sinuses are otherwise largely clear.

Soft tissues: Unremarkable.
IMPRESSION: MRI HEAD IMPRESSION:

Patchy multifocal T2/FLAIR hyperintensities involving the
supratentorial and infratentorial cerebral white matter, most
characteristic of demyelinating disease/multiple sclerosis. Single 6
mm focus of enhancement about a lesion involving the posterior left
periventricular/periatrial white matter at the left temporoccipital
region, consistent with active demyelination.

MRI ORBITS IMPRESSION::

Subtle irregularity, edema, and enhancement involving the left optic
nerve, suspicious for acute optic neuritis. Correlation with
physical exam recommended.

## 2021-11-28 IMAGING — CT CT HEAD W/O CM
4 series · 17 of 47 positions shown, 19 images · non-contrast
Comparison: No pertinent prior exams available for comparison.

CLINICAL DATA: Provided history: Dizziness, nonspecific. Additional
history provided: Weakness, balance difficulty.

EXAM:
CT HEAD WITHOUT CONTRAST
TECHNIQUE: Contiguous axial images were obtained from the base of the skull
through the vertex without intravenous contrast.

[Series 2: head wo · axial · 0.44mm/px · z∈[+1103,+1223]mm · 7 of 34 slices shown, 9 images]
[im 5/34  brain]
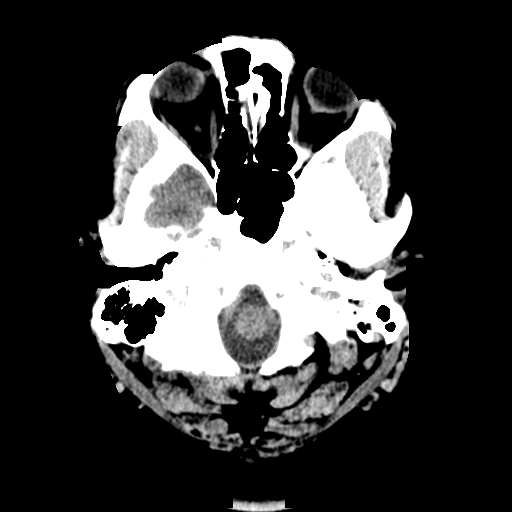
[im 5/34  bone]
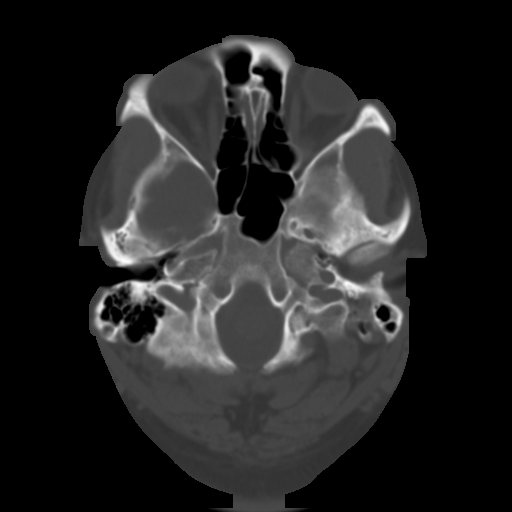
[im 9/34  brain]
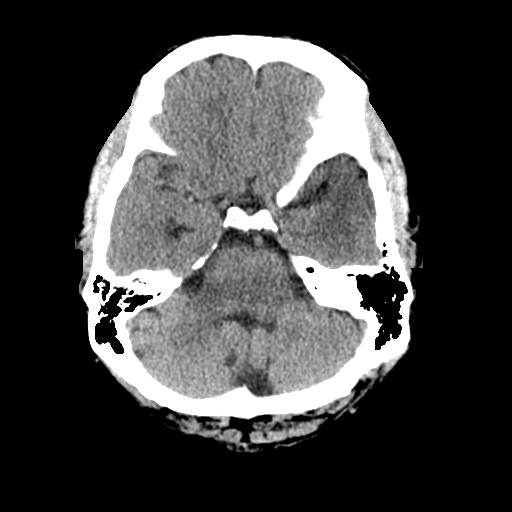
[im 13/34  brain]
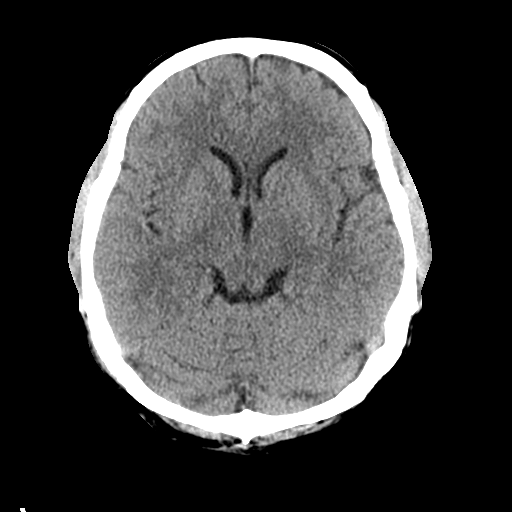
[im 17/34  brain]
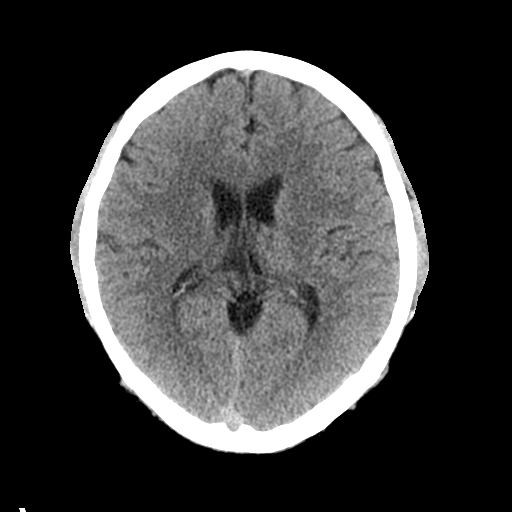
[im 21/34  brain]
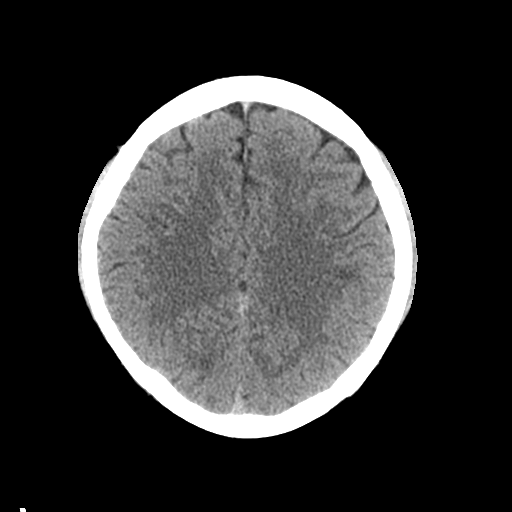
[im 21/34  bone]
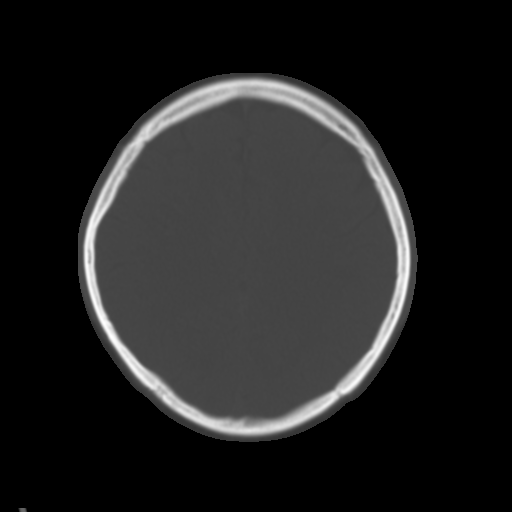
[im 25/34  brain]
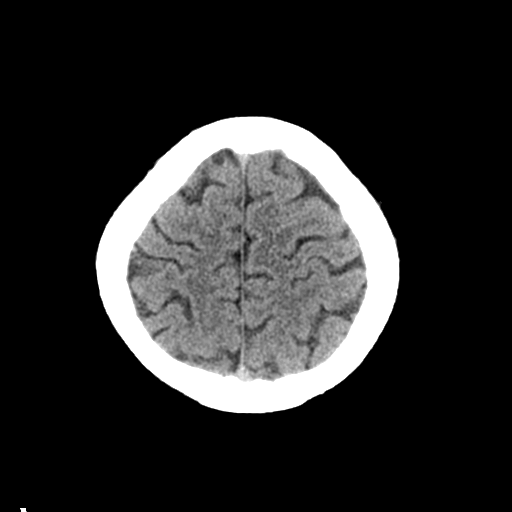
[im 29/34  brain]
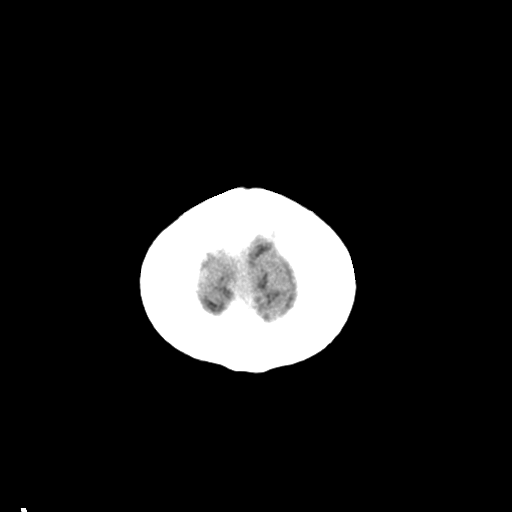

[Series 3: head bone · axial · 0.44mm/px · z∈[+1099,+1157]mm · 4 of 84 slices shown]
[im 9/84  bone]
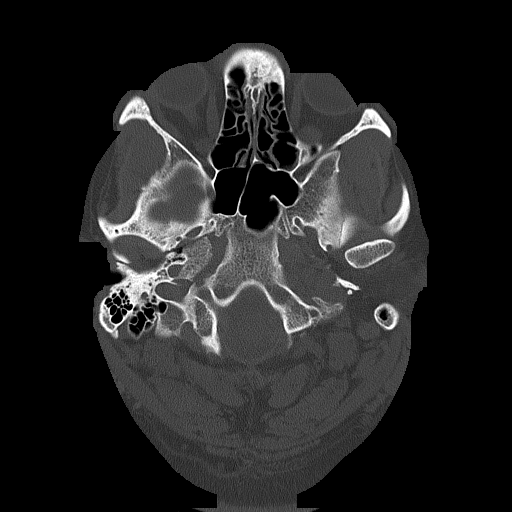
[im 17/84  bone]
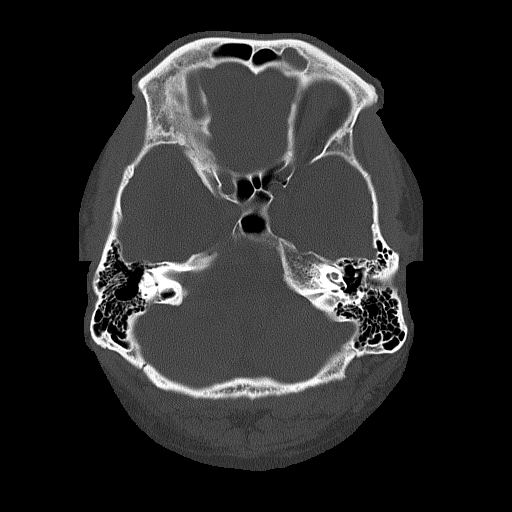
[im 25/84  bone]
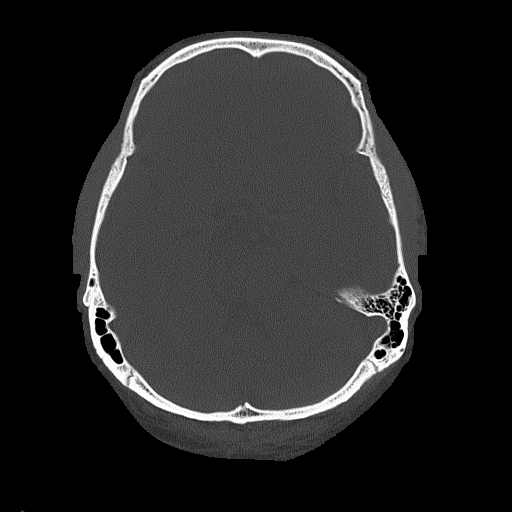
[im 38/84  bone]
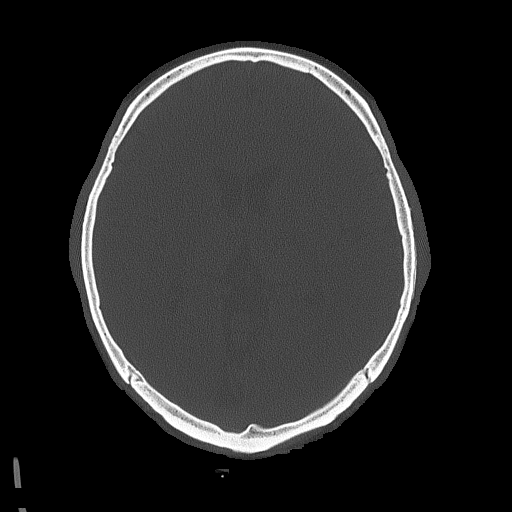

[Series 4: coronal soft tissue · coronal · 0.35mm/px · 3 of 73 slices shown]
[im 25/73  brain]
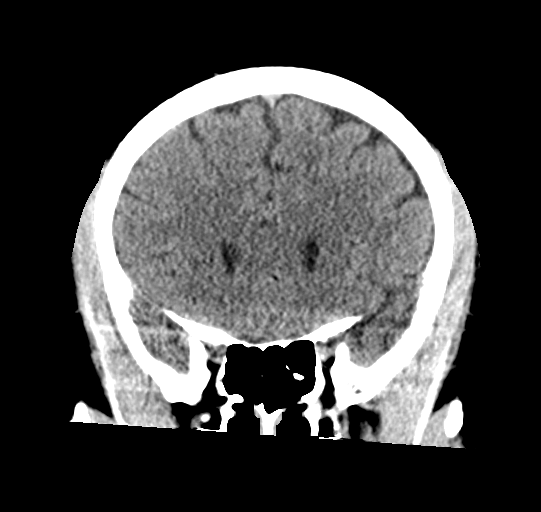
[im 33/73  brain]
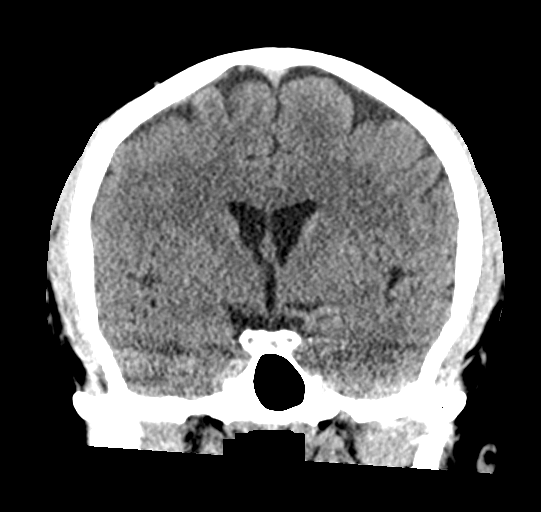
[im 41/73  brain]
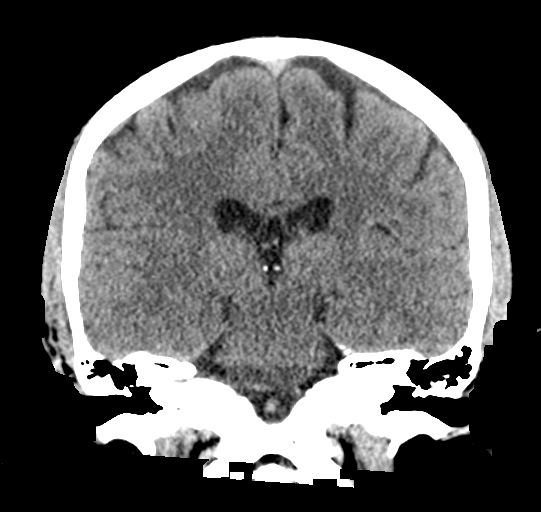

[Series 5: sagittal soft tissue · sagittal · 0.38mm/px · 3 of 62 slices shown]
[im 21/62  brain]
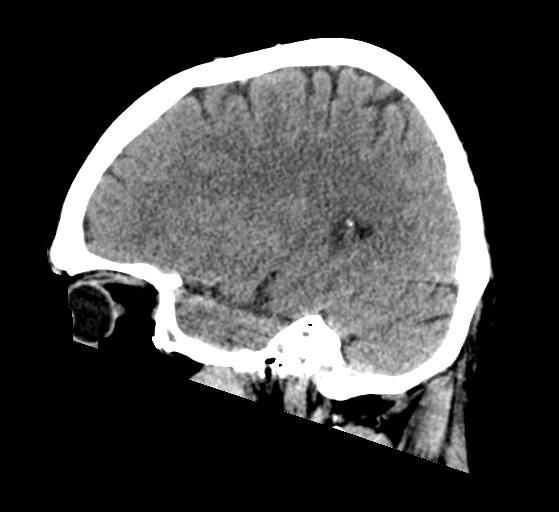
[im 31/62  brain]
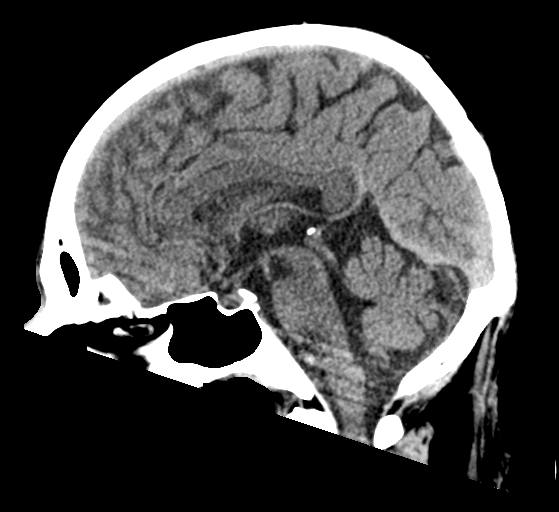
[im 41/62  brain]
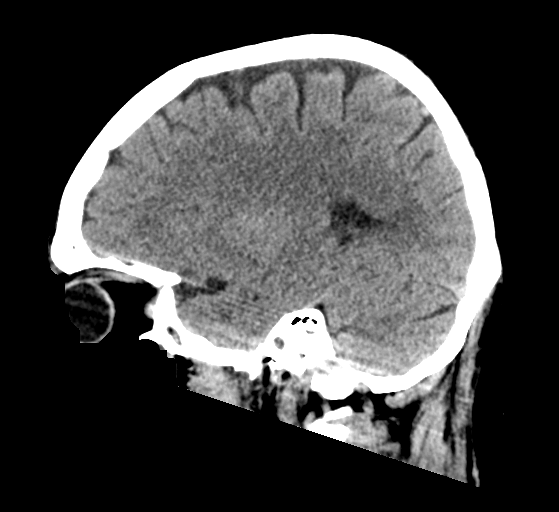

[17 of 47 positions shown; findings below may reference images not displayed]

FINDINGS: Brain:

Cerebral volume is normal.

Mild patchy hypoattenuation within the cerebral white matter,
nonspecific.

There is no acute intracranial hemorrhage.

No demarcated cortical infarct.

No extra-axial fluid collection.

No evidence of an intracranial mass.

No midline shift.

Vascular: No hyperdense vessel.

Skull: Normal. Negative for fracture or focal lesion.

Sinuses/Orbits: Visualized orbits show no acute finding. Small
mucous retention cyst within the left frontal sinus. Trace mucosal
thickening within the bilateral ethmoid sinuses. Superimposed
small-volume frothy secretions within a posterior left ethmoid air
cell.
IMPRESSION: No evidence of acute intracranial abnormality.

Mild nonspecific cerebral white matter disease.

Paranasal sinus disease at the imaged levels, as described.

## 2021-11-28 IMAGING — MR MR CERVICAL SPINE WO/W CM
5 of 9 series · 29 of 48 positions shown · IV contrast (gadavist)
Comparison: None.

CLINICAL DATA: Multiple sclerosis.

EXAM:
MRI CERVICAL SPINE WITHOUT AND WITH CONTRAST
TECHNIQUE: Multiplanar and multiecho pulse sequences of the cervical spine, to
include the craniocervical junction and cervicothoracic junction,
were obtained without and with intravenous contrast.
CONTRAST:  10mL GADAVIST GADOBUTROL 1 MMOL/ML IV SOLN

[Series 5: T2 · sagittal · 3.0mm · 0.62mm/px · 4 of 15 slices shown (1 of 2)]
[im 1/15]
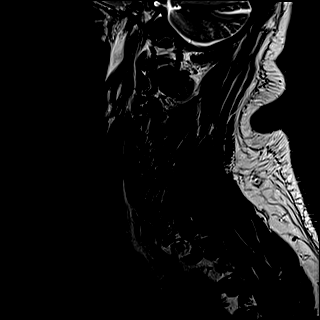
[im 5/15]
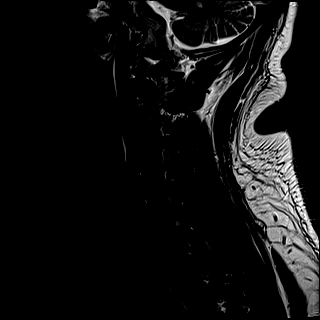
[im 10/15]
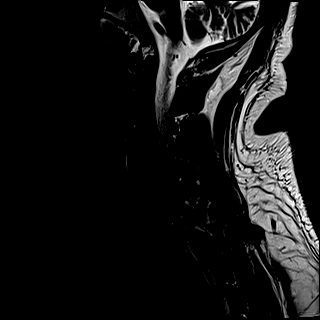
[im 15/15]
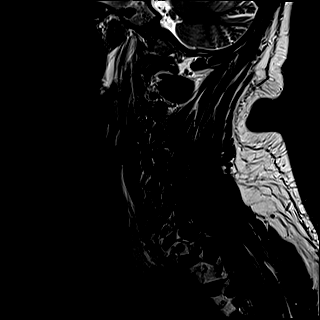

[Series 8: STIR · sagittal · 3.0mm · 0.62mm/px · 4 of 15 slices shown]
[im 1/15]
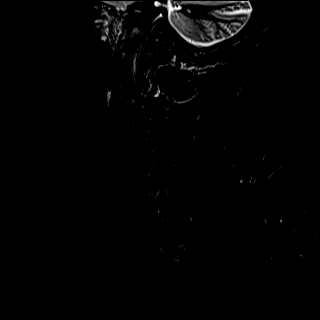
[im 5/15]
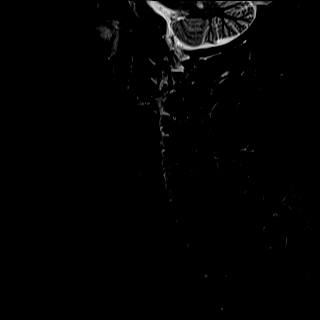
[im 10/15]
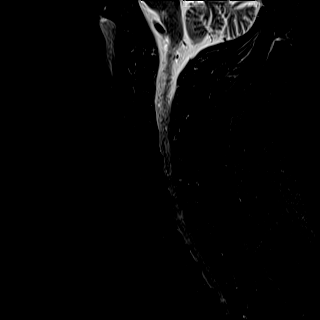
[im 15/15]
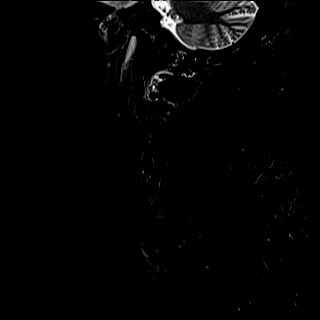

[Series 9: T2 · axial · 3.0mm · 0.70mm/px · z∈[-232,-135]mm · 7 of 29 slices shown (2 of 2)]
[im 1/29]
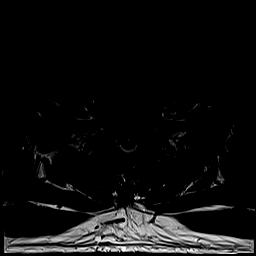
[im 5/29]
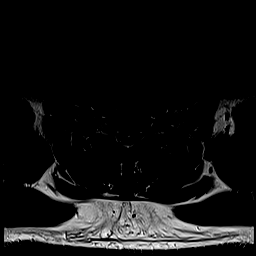
[im 10/29]
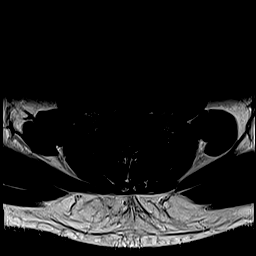
[im 15/29]
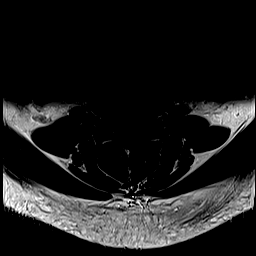
[im 19/29]
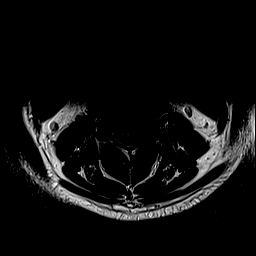
[im 24/29]
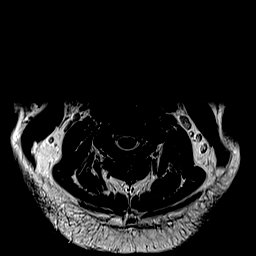
[im 29/29]
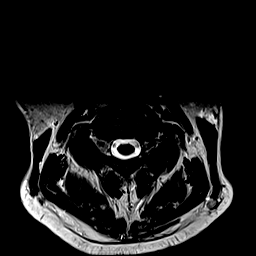

[Series 11: T1 · axial · non-contrast · 3.0mm · 0.35mm/px · z∈[-232,-135]mm · 7 of 29 slices shown]
[im 1/29]
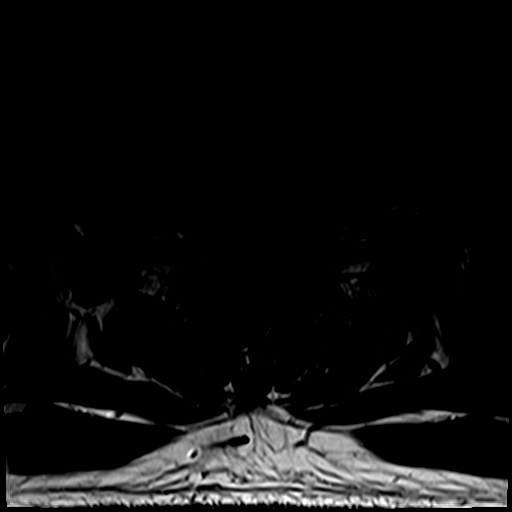
[im 5/29]
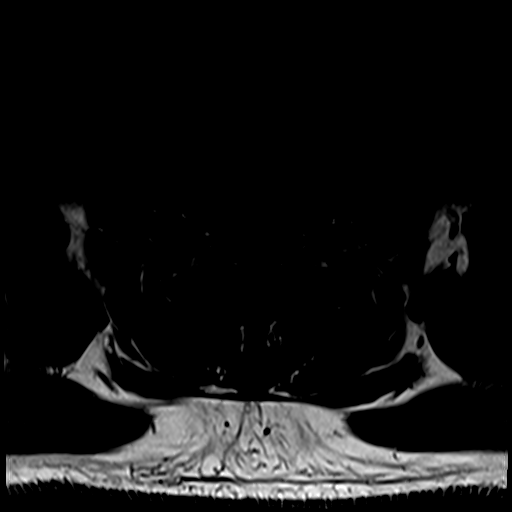
[im 10/29]
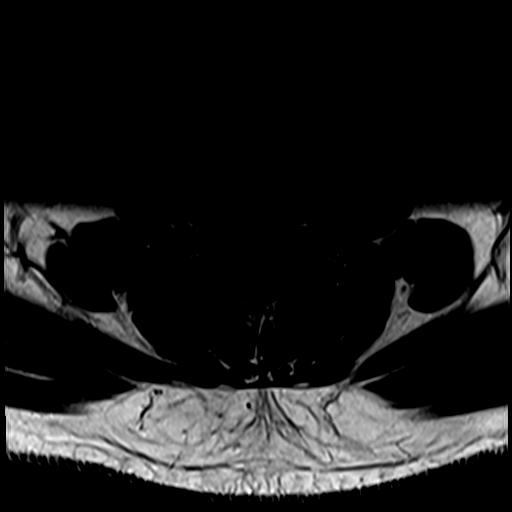
[im 15/29]
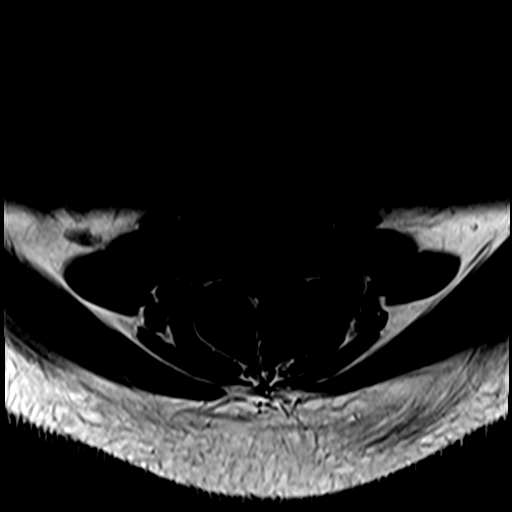
[im 19/29]
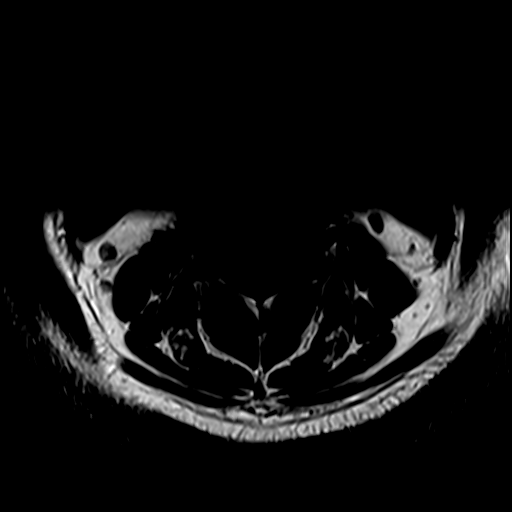
[im 24/29]
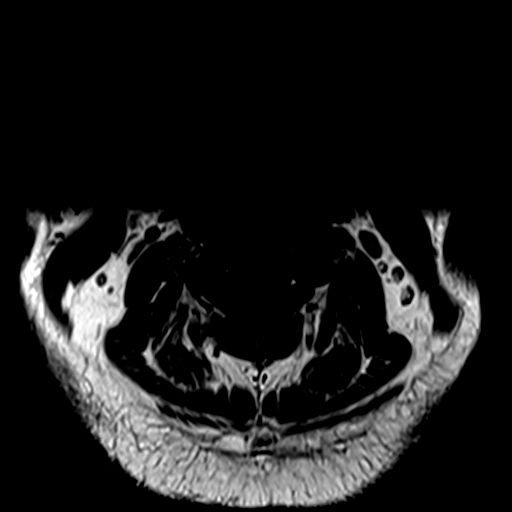
[im 29/29]
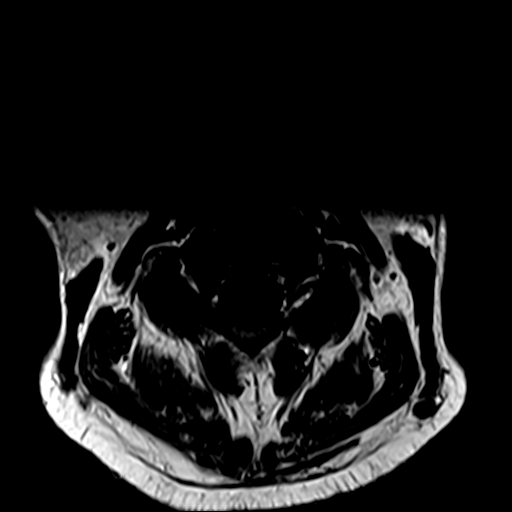

[Series 13: T1 post-contrast · axial · 3.0mm · 0.35mm/px · z∈[-232,-135]mm · 7 of 29 slices shown]
[im 1/29]
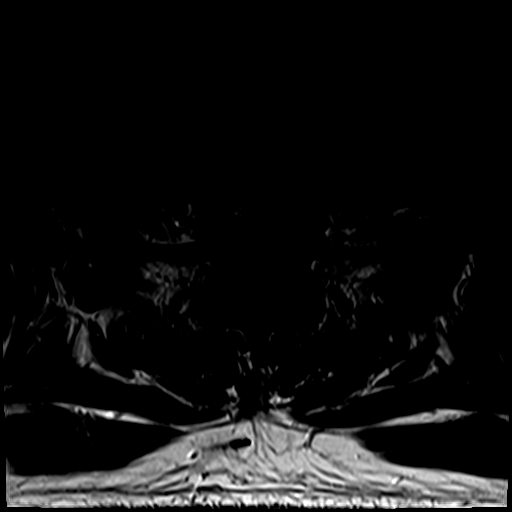
[im 5/29]
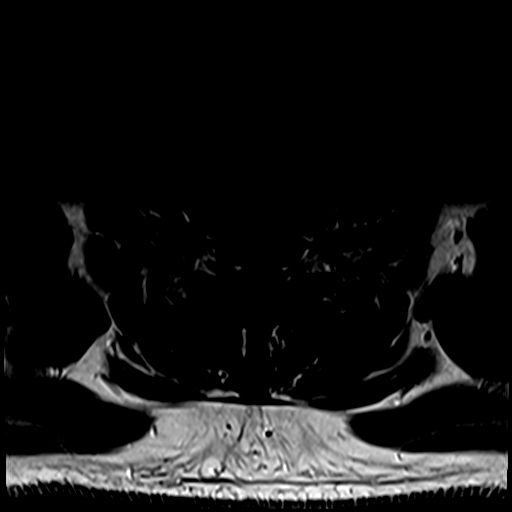
[im 10/29]
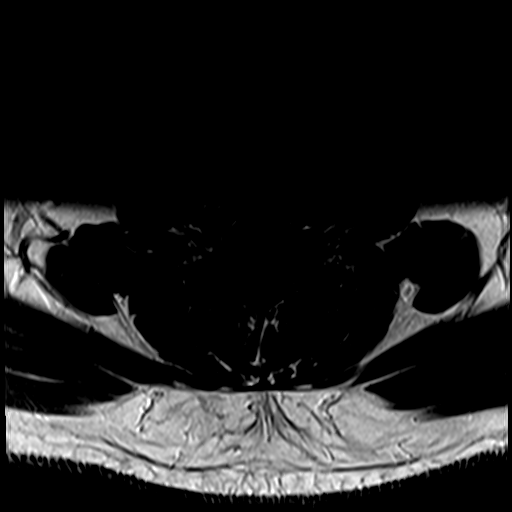
[im 15/29]
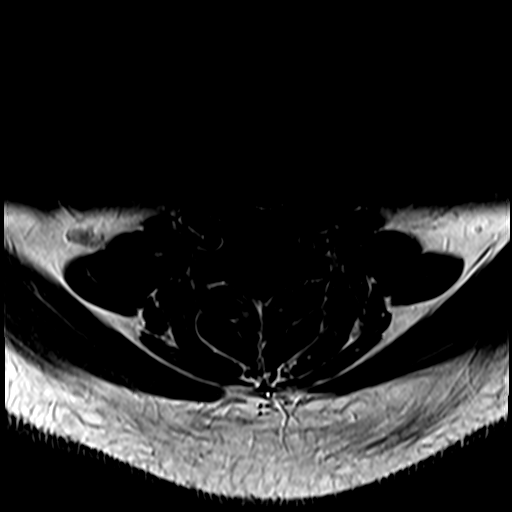
[im 19/29]
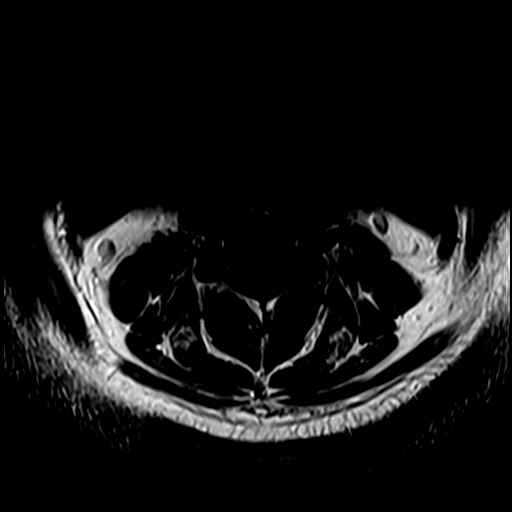
[im 24/29]
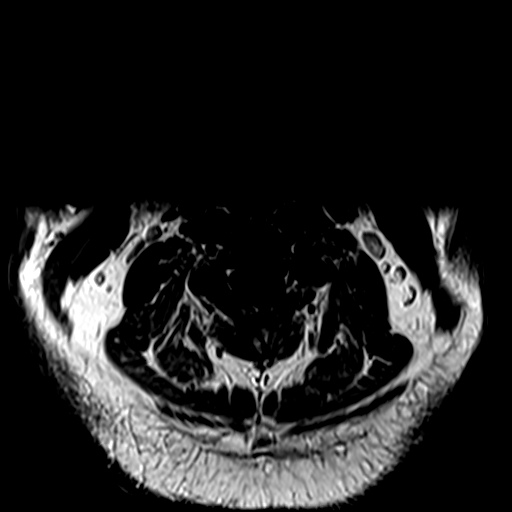
[im 29/29]
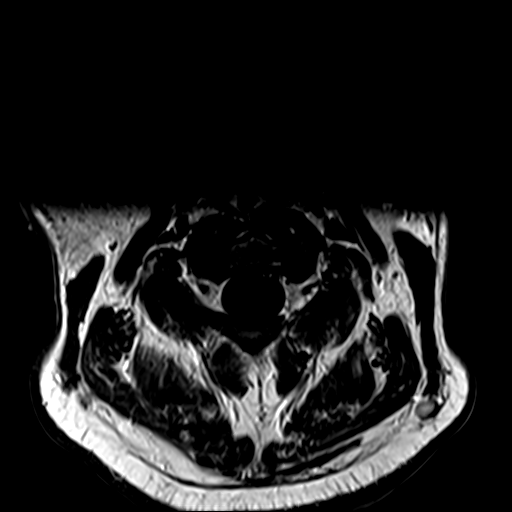

[29 of 48 positions shown; findings below may reference images not displayed]

FINDINGS: Alignment: Normal

Vertebrae: Negative for fracture or mass

Cord: Normal signal morphology.  Normal enhancement of the cord.

Posterior Fossa, vertebral arteries, paraspinal tissues: Negative

Disc levels:

C2-3: Negative

C3-4: Negative

C4-5: Disc degeneration and mild spurring. Mild spinal stenosis and
mild foraminal stenosis bilaterally

C5-6: Disc degeneration with diffuse uncinate spurring right greater
than left. Moderate right foraminal narrowing. Mild spinal stenosis.

C6-7: Disc degeneration with diffuse uncinate spurring. Mild
foraminal narrowing bilaterally

C7-T1: Negative
IMPRESSION: Normal cervical spinal cord without evidence of demyelinating
disease

Cervical spondylosis as above.

## 2021-11-28 IMAGING — MR MR HEAD WO/W CM
14 series · 48 of 48 positions shown · IV contrast (gadavist)
Comparison: Comparison made with prior head CT from earlier the
same day.

CLINICAL DATA: Initial evaluation for possible demyelinating
disease. Several week history of visual disturbance, balance
difficulty.

EXAM:
MRI HEAD AND ORBITS WITHOUT AND WITH CONTRAST
TECHNIQUE: Multiplanar, multiecho pulse sequences of the brain and surrounding
structures were obtained without and with intravenous contrast.
Multiplanar, multiecho pulse sequences of the orbits and surrounding
structures were obtained including fat saturation techniques, before
and after intravenous contrast administration.
CONTRAST:  10mL GADAVIST GADOBUTROL 1 MMOL/ML IV SOLN

[Series 5: ax dwi_tracew · axial · 3.0mm · 0.71mm/px · z∈[-101,+63]mm · 3 of 56 slices shown]
[im 1/56]
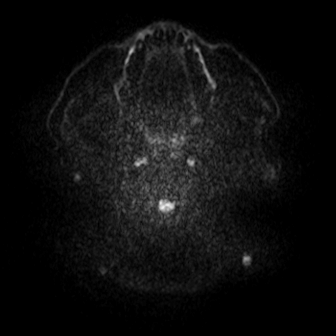
[im 28/56]
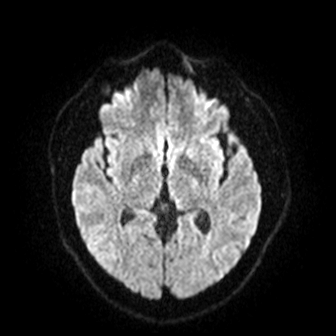
[im 56/56]
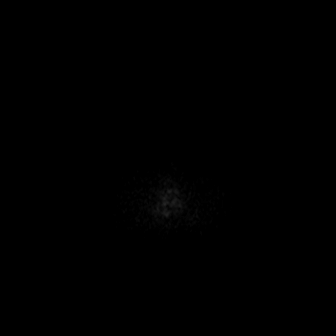

[Series 6: ax dwi_adc · axial · 3.0mm · 0.71mm/px · z∈[-101,+63]mm · 3 of 56 slices shown]
[im 1/56]
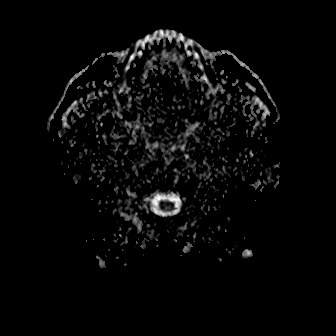
[im 28/56]
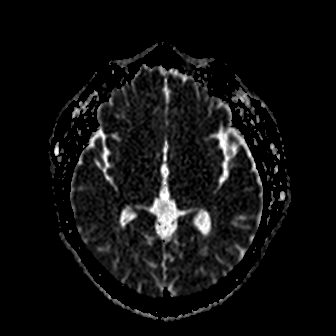
[im 56/56]
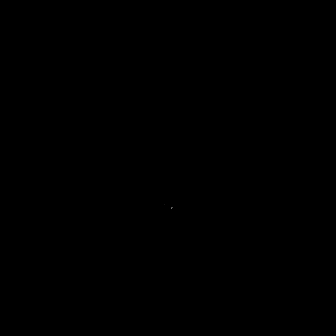

[Series 7: cor dwi_tracew · coronal · 5.0mm · 0.68mm/px · 2 of 40 slices shown]
[im 1/40]
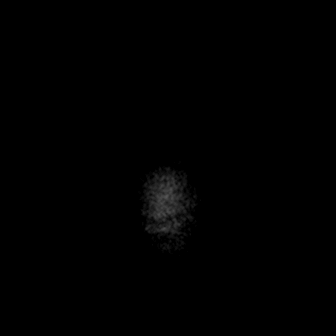
[im 40/40]
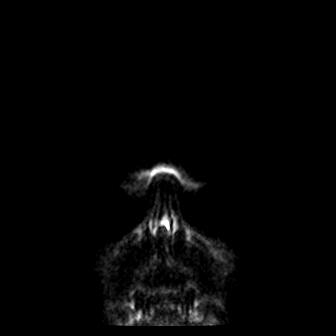

[Series 8: cor dwi_adc · coronal · 5.0mm · 0.68mm/px · 2 of 40 slices shown]
[im 1/40]
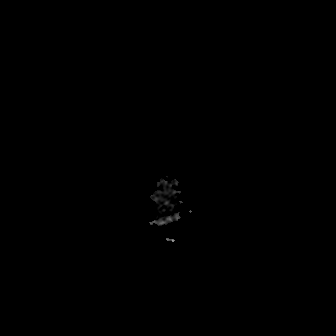
[im 40/40]
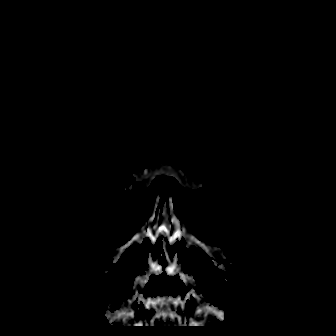

[Series 9: T1 · sagittal · 5.0mm · 0.47mm/px · 1 of 24 slices shown (1 of 2)]
[im 1/24]
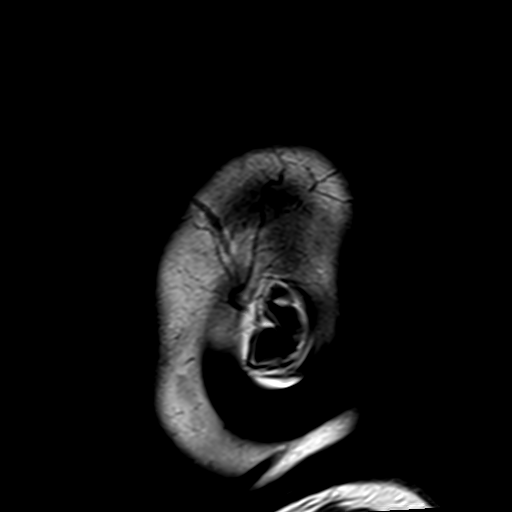

[Series 10: T2 · axial · 5.0mm · 0.86mm/px · 1 of 27 slices shown (1 of 2)]
[im 1/27]
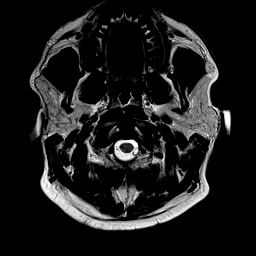

[Series 11: FLAIR · axial · 3.0mm · 0.69mm/px · z∈[-98,+64]mm · 3 of 55 slices shown (1 of 2)]
[im 1/55]
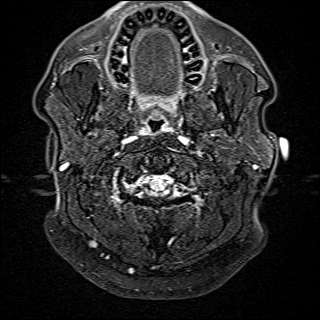
[im 28/55]
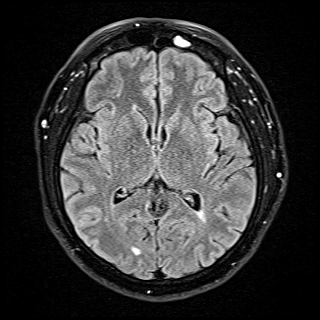
[im 55/55]
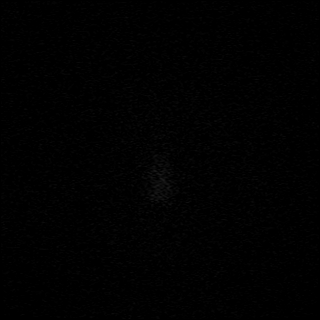

[Series 13: pha_images · axial · 3.0mm · 0.90mm/px · z∈[-93,+60]mm · 3 of 52 slices shown]
[im 1/52]
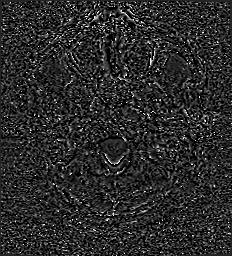
[im 26/52]
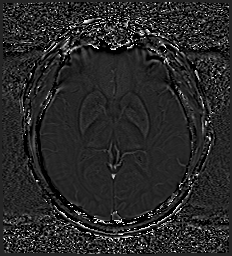
[im 52/52]
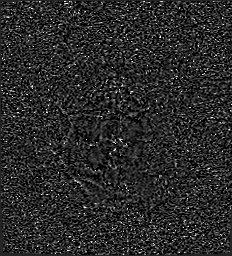

[Series 14: swi_images · axial · 3.0mm · 0.90mm/px · z∈[-93,+60]mm · 3 of 52 slices shown]
[im 1/52]
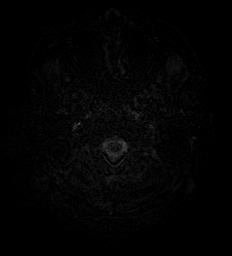
[im 26/52]
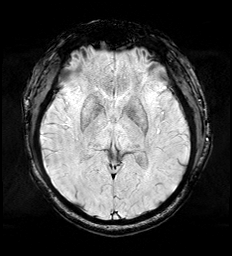
[im 52/52]
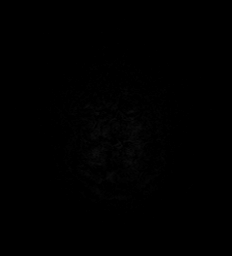

[Series 16: FLAIR · sagittal · 1.0mm · 0.98mm/px · 9 of 176 slices shown (2 of 2)]
[im 1/176]
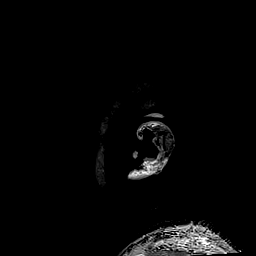
[im 22/176]
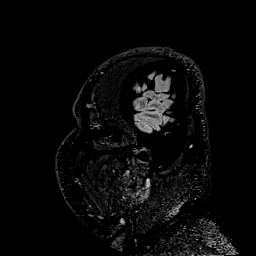
[im 44/176]
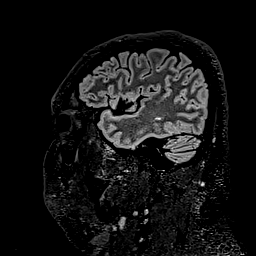
[im 66/176]
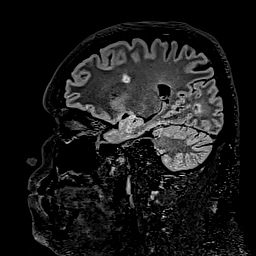
[im 88/176]
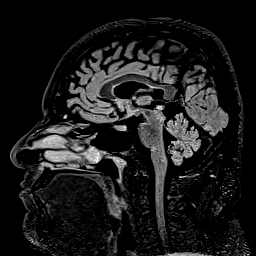
[im 110/176]
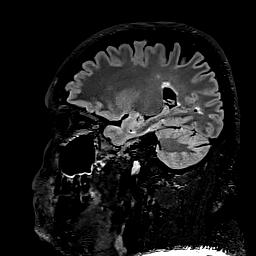
[im 132/176]
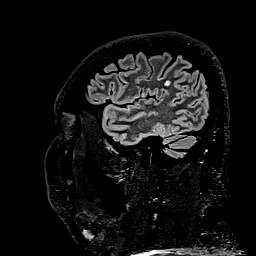
[im 154/176]
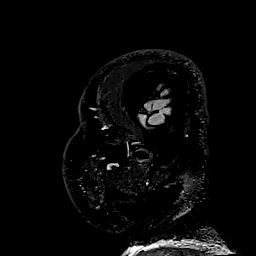
[im 176/176]
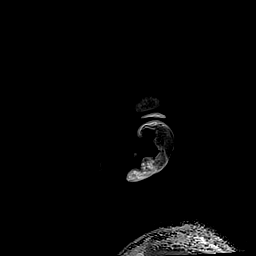

[Series 17: T1 · axial · 1.0mm · 0.98mm/px · z∈[-96,+78]mm · 8 of 172 slices shown (2 of 2)]
[im 1/172]
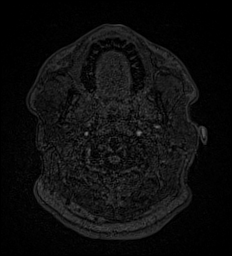
[im 25/172]
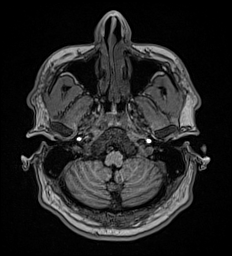
[im 49/172]
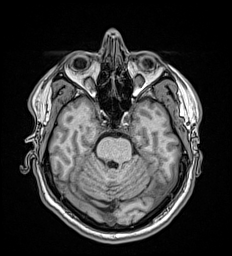
[im 74/172]
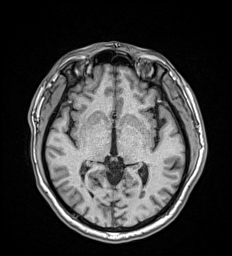
[im 98/172]
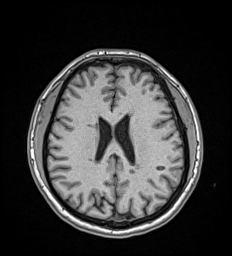
[im 123/172]
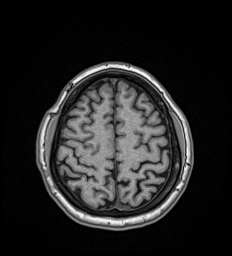
[im 147/172]
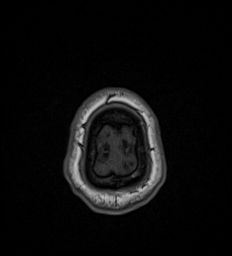
[im 172/172]
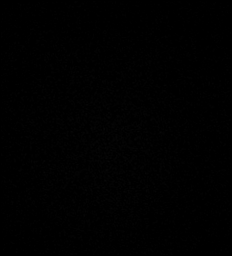

[Series 18: T2 · coronal · 5.0mm · 0.86mm/px · 1 of 30 slices shown (2 of 2)]
[im 1/30]
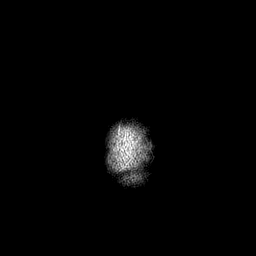

[Series 19: T1 post-contrast · axial · 1.0mm · 0.98mm/px · z∈[-96,+78]mm · 8 of 172 slices shown (1 of 2)]
[im 1/172]
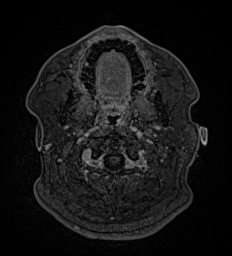
[im 25/172]
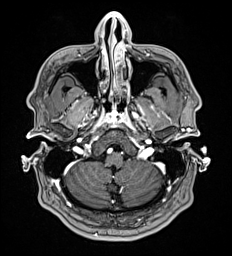
[im 49/172]
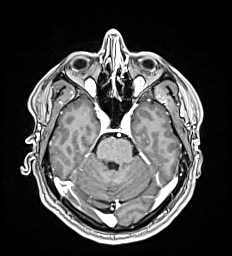
[im 74/172]
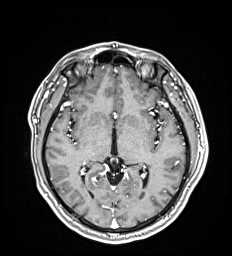
[im 98/172]
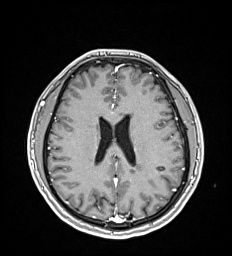
[im 123/172]
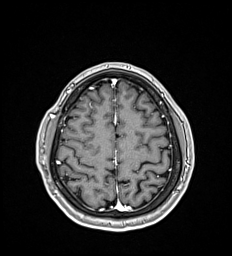
[im 147/172]
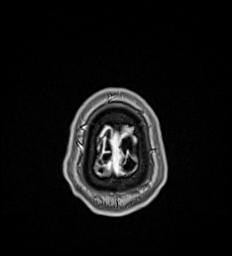
[im 172/172]
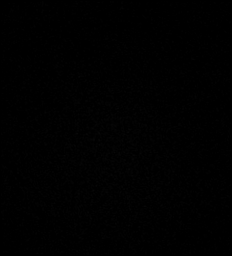

[Series 20: T1 post-contrast · coronal · 5.0mm · 0.43mm/px · 1 of 30 slices shown (2 of 2)]
[im 1/30]
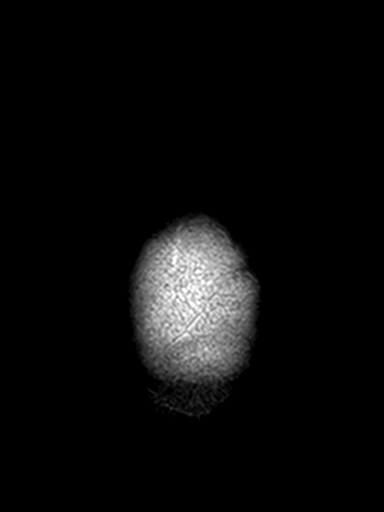

[48 of 48 positions shown; findings below may reference images not displayed]

FINDINGS: MRI HEAD FINDINGS

Brain: Cerebral volume within normal limits. Multiple scattered
patchy foci of T2/FLAIR hyperintensity are seen involving the
periventricular, deep, and juxta cortical white matter both cerebral
hemispheres. Several of these foci are oriented perpendicular to the
lateral ventricles. Scattered callososeptal involvement noted.
Patchy infratentorial involvement noted as well, with signal changes
present about the right dorsal pons and cerebellum. Multiple
corresponding T1 black holes. Overall appearance is highly
characteristic of demyelinating disease/multiple sclerosis. Single 6
mm focus of enhancement about a lesion involving the posterior left
periventricular/periatrial white matter at the left temporal
occipital region is seen, consistent with a focus of active
demyelination (series 20, image 10). No other abnormal enhancement.

No evidence for acute or subacute infarct. Gray-white matter
differentiation maintained. No areas of chronic cortical infarction.
No evidence for acute or chronic intracranial hemorrhage.

No mass lesion, mass effect or midline shift. No hydrocephalus or
extra-axial fluid collection. Pituitary gland suprasellar region
normal. Midline structures intact. No other abnormal enhancement.

Vascular: Major intracranial vascular flow voids are well
maintained.

Skull and upper cervical spine: Craniocervical junction within
normal limits. Bone marrow signal intensity normal. No scalp soft
tissue abnormality.

Other: No mastoid effusion.  Inner ear structures grossly normal.

MRI ORBITS FINDINGS

Orbits: Globes are symmetric in size with normal appearance and
morphology bilaterally. Right optic nerve is normal in appearance
without intrinsic edema or enhancement. No abnormality about the
right optic nerve sheath. On the left, there is subtle irregularity
and probable edema involving the left optic nerve just posterior to
the left globe (series 4, images 24, 23). Suggestion of associated
hazy enhancement within this region (series 8, image 28). Given the
patient's symptoms as well as the findings on corresponding brain
MRI, findings are highly suspicious for possible optic neuritis.

Remainder of the orbital soft tissues within normal limits.
Extra-ocular muscles symmetric and normal. Lacrimal glands normal.
Intraconal and extraconal fat well-maintained. No abnormality about
the orbital apices or cavernous sinus. Optic chiasm normally
situated within the suprasellar cistern.

Visualized sinuses: Small mucous retention cyst present at the left
frontal sinus. Scattered mucosal thickening noted within the
ethmoidal air cells and maxillary sinuses. Visualized paranasal
sinuses are otherwise largely clear.

Soft tissues: Unremarkable.
IMPRESSION: MRI HEAD IMPRESSION:

Patchy multifocal T2/FLAIR hyperintensities involving the
supratentorial and infratentorial cerebral white matter, most
characteristic of demyelinating disease/multiple sclerosis. Single 6
mm focus of enhancement about a lesion involving the posterior left
periventricular/periatrial white matter at the left temporoccipital
region, consistent with active demyelination.

MRI ORBITS IMPRESSION::

Subtle irregularity, edema, and enhancement involving the left optic
nerve, suspicious for acute optic neuritis. Correlation with
physical exam recommended.

## 2021-11-28 MED ORDER — GADOBUTROL 1 MMOL/ML IV SOLN
10.0000 mL | Freq: Once | INTRAVENOUS | Status: AC | PRN
  Administered 2021-11-28: 19:00:00 10 mL via INTRAVENOUS
  Filled 2021-11-28: qty 10

## 2021-11-28 MED ORDER — INSULIN ASPART 100 UNIT/ML IJ SOLN
0.0000 [IU] | Freq: Three times a day (TID) | INTRAMUSCULAR | Status: DC
  Administered 2021-11-29: 13:00:00 7 [IU] via SUBCUTANEOUS
  Administered 2021-11-29: 18:00:00 4 [IU] via SUBCUTANEOUS
  Administered 2021-11-29: 10:00:00 7 [IU] via SUBCUTANEOUS
  Administered 2021-11-30: 16:00:00 4 [IU] via SUBCUTANEOUS
  Administered 2021-11-30 (×2): 3 [IU] via SUBCUTANEOUS
  Administered 2021-12-01: 18:00:00 4 [IU] via SUBCUTANEOUS
  Administered 2021-12-01: 13:00:00 3 [IU] via SUBCUTANEOUS
  Filled 2021-11-28 (×9): qty 1

## 2021-11-28 MED ORDER — SODIUM CHLORIDE 0.9 % IV SOLN
1000.0000 mg | Freq: Every day | INTRAVENOUS | Status: DC
  Administered 2021-11-28 – 2021-11-29 (×2): 1000 mg via INTRAVENOUS
  Filled 2021-11-28: qty 16
  Filled 2021-11-28 (×2): qty 1000

## 2021-11-28 MED ORDER — TRAZODONE HCL 50 MG PO TABS: 50.0000 mg | ORAL_TABLET | Freq: Every evening | ORAL | Status: DC | PRN

## 2021-11-28 MED ORDER — HEPARIN SODIUM (PORCINE) 5000 UNIT/ML IJ SOLN: 5000.0000 [IU] | Freq: Three times a day (TID) | INTRAMUSCULAR | Status: DC

## 2021-11-28 MED ORDER — SODIUM CHLORIDE 0.9 % IV SOLN: 1000.0000 mg | Freq: Every day | INTRAVENOUS | Status: DC

## 2021-11-28 MED ORDER — INSULIN ASPART 100 UNIT/ML IJ SOLN: 0.0000 [IU] | Freq: Every day | INTRAMUSCULAR | Status: DC

## 2021-11-28 NOTE — ED Triage Notes (Signed)
Pt here for dizziness for a year but states that it has gotten worse over the past few days. Pt states both legs have increased weakness and he has been loosing his balance lately. Pt denies HA and has equal grip strength and symmetry to his face. Pt went to his primary who referred him to the ED. Pt in no distress in triage.

## 2021-11-28 NOTE — H&P (Addendum)
H&P:    Trevor Boone   ULA:[id number] DOB: Sep 14, 1986 DOA: 11/28/2021  PCP: Pcp, No  Chief Complaint: Vision issues, difficulty ambulating, dizziness  History of Present Illness:    HPI: Trevor Boone is a 35 y.o. male with a past medical history of morbid obesity, tobacco use disorder and currently smokes 1/3 packs/day.  This patient presents to the emergency department from home after about 1 year of blurry vision, and intermittent double vision.  Saw ophthalmology outpatient and was given glasses to help with his vision which did not help much.  He also was having difficulty with balance and difficulty with depth perception.  Occasional difficulty ambulating.  He notices symptoms are worse when he had an upper respiratory infection or other viral infection.  He saw his primary care doctor and was treated for vertigo with meclizine which helped minimally.  He recently went to a local urgent care and was advised to come to the emergency department.  Upon my exam he is resting comfortably.  Some decreased left arm sensation on exam.  No focal neurological deficits on my exam.  5 out of 5 muscle strength throughout all 4 extremities.  Does have some issue with tandem gait.  Finger-to-nose intact bilaterally.  The ER provider did get in touch with neurology, Dr. Selina Cooley and she recommended further imaging including MR brain with and without contrast, MRI C-spine with and without contrast and MR orbits with and without contrast.  The patient denies any fevers or chills.  He denies any shortness of breath.  He denies any chest pain.  He denies any urinary complaints.  He denies any nausea, vomiting or diarrhea.  No abdominal pain.  ED Course: CT head showed mild specific cerebral white matter disease.  His COVID-19 PCR is positive.  He is asymptomatic.  Labs are unremarkable.  EKG is unremarkable.    ROS:   14 point review of systems is negative except for what is mentioned above in  the HPI.   Past Medical History:   No past medical history on file.  Past Surgical History:   No past surgical history on file.  Social History:   Social History   Socioeconomic History   Marital status: Married    Spouse name: Not on file   Number of children: Not on file   Years of education: Not on file   Highest education level: Not on file  Occupational History   Not on file  Tobacco Use   Smoking status: Every Day    Packs/day: 0.33    Types: Cigarettes   Smokeless tobacco: Never  Substance and Sexual Activity   Alcohol use: Not on file   Drug use: Not on file   Sexual activity: Not on file  Other Topics Concern   Not on file  Social History Narrative   Not on file   Social Determinants of Health   Financial Resource Strain: Not on file  Food Insecurity: Not on file  Transportation Needs: Not on file  Physical Activity: Not on file  Stress: Not on file  Social Connections: Not on file  Intimate Partner Violence: Not on file    Allergies:   No Known Allergies  Family History:   No family history on file.   Current Medications:   Prior to Admission medications   Not on File     Physical Exam:   Vitals:   11/28/21 1151  BP: (!) 143/87  Pulse: 87  Resp: 18  Temp: 97.7 F (36.5 C)  TempSrc: Oral  SpO2: 96%  Weight: 111.1 kg  Height: 5\' 6"  (1.676 m)     General:  Appears calm and comfortable and is in NAD Cardiovascular:  RRR, no m/r/g.  Respiratory:   CTA bilaterally with no wheezes/rales/rhonchi.  Normal respiratory effort. Abdomen:  soft, NT, ND, NABS Skin:  no rash or induration seen on limited exam Musculoskeletal:  grossly normal tone BUE/BLE, good ROM, no bony abnormality Lower extremity:  No LE edema.  Limited foot exam with no ulcerations.  2+ distal pulses. Psychiatric:  grossly normal mood and affect, speech fluent and appropriate, AOx3 Neurologic: Alert and oriented x3, no slurred speech, no facial droop.  Right-sided  INO, 5 out of 5 muscle strength all 4 extremities.  Decreased sensation in the left upper arm compared to the right.  Finger-to-nose intact bilaterally.  No ataxia.  Some difficulty with tandem gait.    Data Review:    Radiological Exams on Admission: Independently reviewed - see discussion in A/P where applicable  CT HEAD WO CONTRAST  Result Date: 11/28/2021 CLINICAL DATA:  Provided history: Dizziness, nonspecific. Additional history provided: Weakness, balance difficulty. EXAM: CT HEAD WITHOUT CONTRAST TECHNIQUE: Contiguous axial images were obtained from the base of the skull through the vertex without intravenous contrast. COMPARISON:  No pertinent prior exams available for comparison. FINDINGS: Brain: Cerebral volume is normal. Mild patchy hypoattenuation within the cerebral white matter, nonspecific. There is no acute intracranial hemorrhage. No demarcated cortical infarct. No extra-axial fluid collection. No evidence of an intracranial mass. No midline shift. Vascular: No hyperdense vessel. Skull: Normal. Negative for fracture or focal lesion. Sinuses/Orbits: Visualized orbits show no acute finding. Small mucous retention cyst within the left frontal sinus. Trace mucosal thickening within the bilateral ethmoid sinuses. Superimposed small-volume frothy secretions within a posterior left ethmoid air cell. IMPRESSION: No evidence of acute intracranial abnormality. Mild nonspecific cerebral white matter disease. Paranasal sinus disease at the imaged levels, as described. Electronically Signed   By: 11/30/2021 D.O.   On: 11/28/2021 12:36    EKG: Independently reviewed.  NSR with rate 88   Labs on Admission: I have personally reviewed the available labs and imaging studies at the time of the admission.  Pertinent labs on Admission: WBC 11.8     Assessment/Plan:    Subacute neurologic symptoms: Include primarily vision changes, diplopia, difficulty with balance as well as intermittent  lower extremity weakness.  CT head showed mild specific cerebral white matter disease.  Neurology consulted and already aware.  They recommended further imaging including MR brain with and without contrast, MRI C-spine with and without contrast and MR orbits with and without contrast.  Did discuss with neurology and they stated if MR imaging shows demyelinating disease to start IV steroids with Solu-Medrol 1 g daily for 5 days.  Morbid obesity: BMI 39 per EMR.  No acute treatment.  Tobacco use disorder: Counseled on smoking cessation  COVID-19 positive: Asymptomatic.  Droplet and contact precautions   Other information:    Level of Care: MedSurg DVT prophylaxis: SCDs Code Status: Full code Consults: Neurology Admission status: Observation   11/30/2021 DO [facility]   How to contact the [facility] Attending or Consulting provider 7A - 7P or covering provider during after hours 7P -7A, for this patient?  Check the care team in [facility] and look for a) attending/consulting [facility] provider listed and b) the [facility] team listed Log into [email or web address] and use [facility]'s universal  password to access. If you do not have the password, please contact the hospital operator. Locate the [facility] provider you are looking for under [facility] and page to a number that you can be directly reached. If you still have difficulty reaching the provider, please page the [facility] (Director on Call) for the Hospitalists listed on amion for assistance.   11/28/2021, 5:33 PM

## 2021-11-28 NOTE — ED Provider Notes (Signed)
[facility]  ____________________________________________   Event Date/Time   First MD Initiated Contact with Patient 11/28/21 1329     (approximate)  I have reviewed the triage vital signs and the nursing notes.   HISTORY  Chief Complaint Dizziness    HPI Fenris Woody is a 35 y.o. male with no pmh who presents with feeling off balance.  Patient notes that symptoms started about a year ago.  Initially started with blurred vision and intermittent double vision.  He was initially seen by an ophthalmologist who prescribed him glasses but he notes that even with the glasses his symptoms of not really improved.  Also endorses difficulty with depth perception especially with walking upstairs.  He has also had intermittent dizziness described as a spinning sensation and has been treated with meclizine for vertigo, which is not helped.  Patient also endorses feeling like his balance is off and that he is drunk.  Also endorses intermittent weakness in the bilateral lower extremities.  Denies bowel or bladder incontinence neck or back pain.  He also notes intermittent pain down the left arm associated with numbness.  Was actually seen at [facility] for this.  Does not currently have pain or numbness in the arm at this time.  Denies headaches.  Patient notes that over the last year when he gets sick with a flulike illness or cold his symptoms are much worse and that he has barely able to ambulate.  No family history of an eating disease or autoimmune disorder.  [facility] seen [facility] and advised to go to the emergency department for further evaluation.         No past medical history on file.  There are no problems to display for this patient.   No past surgical history on file.  Prior to Admission medications   Not on File    Allergies Patient has no known allergies.  No family history on file.  Social History Social History   Tobacco Use    Smoking status: Never   Smokeless tobacco: Never    Review of Systems   Review of Systems  Constitutional:  Negative for chills and fever.  Eyes:  Positive for visual disturbance. Negative for pain.  Musculoskeletal:  Positive for gait problem. Negative for back pain and neck pain.  Neurological:  Positive for weakness and numbness. Negative for light-headedness.  All other systems reviewed and are negative.  Physical Exam Updated Vital Signs BP (!) 143/87 (BP Location: Left Arm)    Pulse 87    Temp 97.7 F (36.5 C) (Oral)    Resp 18    Ht 5\' 6"  (1.676 m)    Wt 111.1 kg    SpO2 96%    BMI 39.54 kg/m   Physical Exam Vitals and nursing note reviewed.  Constitutional:      General: He is not in acute distress.    Appearance: Normal appearance.  HENT:     Head: Normocephalic and atraumatic.  Eyes:     General: No scleral icterus.    Conjunctiva/sclera: Conjunctivae normal.  Pulmonary:     Effort: Pulmonary effort is normal. No respiratory distress.     Breath sounds: Normal breath sounds. No wheezing.  Musculoskeletal:        General: No deformity or signs of injury.     Cervical back: Normal range of motion.  Skin:    Coloration: Skin is not jaundiced or pale.  Neurological:  General: No focal deficit present.     Mental Status: He is alert and oriented to person, place, and time. Mental status is at baseline.     Comments: Aox3, nml speech  PERRL, face symmetric, nml tongue movement  R sided internuclear ophthalmoplegia; unable to fully abduct the right eye with gaze evoked nystagmus of the left, associated with horizontal diplopia 5/5 strength in the BL upper and lower extremities  Decree sensation in the left arm compared to right Finger-nose-finger intact BL No ataxia, some difficulty with tandem gait, negative Romberg sign   Psychiatric:        Mood and Affect: Mood normal.        Behavior: Behavior normal.     LABS (all labs ordered are listed, but only  abnormal results are displayed)  Labs Reviewed  CBC - Abnormal; Notable for the following components:      Result Value   WBC 11.8 (*)    All other components within normal limits  RESP PANEL BY RT-PCR (FLU A&B, COVID) ARPGX2  BASIC METABOLIC PANEL  URINALYSIS, ROUTINE W REFLEX MICROSCOPIC  CBG MONITORING, ED   ____________________________________________  EKG   ____________________________________________  RADIOLOGY Ky Barban, personally viewed and evaluated these images (plain radiographs) as part of my medical decision making, as well as reviewing the written report by the radiologist.  ED MD interpretation: Reviewed the CT head which shows nonspecific white matter changes but no acute abnormality    ____________________________________________   PROCEDURES  Procedure(s) performed (including Critical Care):  Procedures   ____________________________________________   INITIAL IMPRESSION / ASSESSMENT AND PLAN / ED COURSE     Patient is a 35 year old male presents with subacute neurologic symptoms primarily visual change, diplopia difficulty with balance as well and intermittent lower extremity weakness.  There is really no acute change today that brought him in, he was just told by a physician at [facility] last week that he should be evaluated in the emergency department.  On exam he has a right-sided INO and subjective decrease sensation of the left arm.  He is able to ambulate but does have some difficulty with tandem gait.  Otherwise his neurologic exam is normal.  CT head was obtained which shows nonspecific white matter changes but no acute abnormality.  I am highly concerned for demyelinating disease given the nature of his symptoms and his exam today.  Discussed with neurology Dr. Selina Cooley who recommends MRI with and without of the brain orbit and C-spine.  She recommends against starting Solu-Medrol at this time but does agree with admission for ongoing  work-up for MS.      ____________________________________________   FINAL CLINICAL IMPRESSION(S) / ED DIAGNOSES  Final diagnoses:  Dizziness  Internuclear ophthalmoplegia of right eye     ED Discharge Orders     None        Note:  This document was prepared using Dragon voice recognition software and may include unintentional dictation errors.    Georga Hacking, MD 11/28/21 [phone]

## 2021-11-29 DIAGNOSIS — R2689 Other abnormalities of gait and mobility: Secondary | ICD-10-CM

## 2021-11-29 DIAGNOSIS — F17210 Nicotine dependence, cigarettes, uncomplicated: Secondary | ICD-10-CM | POA: Diagnosis present

## 2021-11-29 DIAGNOSIS — U071 COVID-19: Secondary | ICD-10-CM | POA: Diagnosis present

## 2021-11-29 DIAGNOSIS — G35 Multiple sclerosis: Secondary | ICD-10-CM

## 2021-11-29 DIAGNOSIS — H5500 Unspecified nystagmus: Secondary | ICD-10-CM | POA: Diagnosis not present

## 2021-11-29 DIAGNOSIS — R278 Other lack of coordination: Secondary | ICD-10-CM | POA: Diagnosis not present

## 2021-11-29 DIAGNOSIS — H5121 Internuclear ophthalmoplegia, right eye: Secondary | ICD-10-CM

## 2021-11-29 DIAGNOSIS — T380X5A Adverse effect of glucocorticoids and synthetic analogues, initial encounter: Secondary | ICD-10-CM | POA: Diagnosis present

## 2021-11-29 DIAGNOSIS — R42 Dizziness and giddiness: Secondary | ICD-10-CM | POA: Diagnosis present

## 2021-11-29 DIAGNOSIS — H532 Diplopia: Secondary | ICD-10-CM | POA: Diagnosis not present

## 2021-11-29 DIAGNOSIS — H469 Unspecified optic neuritis: Secondary | ICD-10-CM | POA: Diagnosis not present

## 2021-11-29 DIAGNOSIS — H5509 Other forms of nystagmus: Secondary | ICD-10-CM | POA: Diagnosis present

## 2021-11-29 DIAGNOSIS — Z6839 Body mass index (BMI) 39.0-39.9, adult: Secondary | ICD-10-CM | POA: Diagnosis not present

## 2021-11-29 DIAGNOSIS — R739 Hyperglycemia, unspecified: Secondary | ICD-10-CM | POA: Diagnosis present

## 2021-11-29 LAB — GLUCOSE, CAPILLARY
Glucose-Capillary: 210 mg/dL — ABNORMAL HIGH (ref 70–99)
Glucose-Capillary: 211 mg/dL — ABNORMAL HIGH (ref 70–99)
Glucose-Capillary: 198 mg/dL — ABNORMAL HIGH (ref 70–99)
Glucose-Capillary: 166 mg/dL — ABNORMAL HIGH (ref 70–99)

## 2021-11-29 LAB — HIV ANTIBODY (ROUTINE TESTING W REFLEX): HIV Screen 4th Generation wRfx: NONREACTIVE

## 2021-11-29 MED ORDER — INSULIN GLARGINE-YFGN 100 UNIT/ML ~~LOC~~ SOLN
15.0000 [IU] | Freq: Every day | SUBCUTANEOUS | Status: DC
  Administered 2021-11-29 – 2021-11-30 (×2): 15 [IU] via SUBCUTANEOUS
  Filled 2021-11-29 (×3): qty 0.15

## 2021-11-29 NOTE — Progress Notes (Signed)
Patient seen in consultation this evening by neurology. MRI brain with multiple T2/FLAIR hyperintensities in pattern typical for MS. One lesion enhances. Agree with solumedrol 1000mg  q 24 hrs x5 days. I spent a long time with patient and his significant other answering questions. We will continue discussion tomorrow. I will order additional labwork to help inform choice of disease-modifying therapy which will be started as an outpatient. Will obtain MRI t spine wwo contrast tomorrow.  Full consult note to follow.  , MD [facility] [phone]  If 7pm- 7am, please page neurology on call as listed in AMION.

## 2021-11-29 NOTE — Plan of Care (Signed)
  Problem: Health Behavior/Discharge Planning: Goal: Ability to manage health-related needs will improve Outcome: Progressing   Problem: Clinical Measurements: Goal: Ability to maintain clinical measurements within normal limits will improve Outcome: Progressing   

## 2021-11-29 NOTE — Evaluation (Signed)
Physical Therapy Evaluation Patient Details Name: Trevor Boone MRN: [id number] DOB: 03/26/1986 Today's Date: 11/29/2021  History of Present Illness  Trevor Boone is a 35yoM 12/20 c dizziness, difficulty walking. Reports onset diplopia and blurred vision 1 year ago. Pt notes ongoing difficulty with depth perception, not much help with glassess prescription. PCP treated pt for "vertigo" dizziness 1 year ago with meclizine, however pt denies any frank vertiginous phenomenon or room spinning sensation. Pt sent here from urgent care, positive CovId (asymptomatic). MRI here showing some cortical changes. subtle irregularity, edema, and enhancement involving the left optic nerve, suspicious for acute optic neuritis.Pt reports sick on 12/11 went to urgent care 12/12 tested (+) COVID. Thanksgiving had fever, vomitting diarrhea; for 2-3 days. Both episodes had exacerbation of his visual and imbalance symptoms.  Clinical Impression  Pt admitted with above diagnosis. Pt currently with functional limitations due to the deficits listed below (see "PT Problem List"). Patient agreeable to PT evaluation. Patient provides detailed description of PLOF and home environment. Patient's assessment this date reveals full independence with mobility, static ongoing disequilibrium and visual difficulty, but below threshold for impairment in mobility and function. All balance screening this visit WNL for age/activity level, but suggest OPPT neuro balance evaluation for more thorough assessment. MRI result posted prior to session, but no notes for interpretation available from attending or neuro- not discussed with patient. PT signing off at this time.    Recommendations for follow up therapy are one component of a multi-disciplinary discharge planning process, led by the attending physician.  Recommendations may be updated based on patient status, additional functional criteria and insurance authorization.  Follow Up  Recommendations Outpatient PT (in depth balance eval at OPPT Neuro)    Assistance Recommended at Discharge None  Functional Status Assessment Patient has had a recent decline in their functional status and demonstrates the ability to make significant improvements in function in a reasonable and predictable amount of time.  Equipment Recommendations  None recommended by PT    Recommendations for Other Services       Precautions / Restrictions Precautions Precautions: None Restrictions Weight Bearing Restrictions: No      Mobility  Bed Mobility Overal bed mobility: Independent                  Transfers Overall transfer level: Independent                      Ambulation/Gait Ambulation/Gait assistance: Independent Gait Distance (Feet): 500 Feet Assistive device: None            Stairs            Wheelchair Mobility    Modified Rankin (Stroke Patients Only)       Balance Overall balance assessment: Independent                                           Pertinent Vitals/Pain Pain Assessment: No/denies pain    Home Living Family/patient expects to be discharged to:: Private residence Living Arrangements: Spouse/significant other Available Help at Discharge: Family (5 children, 3 of which are old enough to help) Type of Home: House Home Access: Stairs to enter Entrance Stairs-Rails: Left Entrance Stairs-Number of Steps: 4   Home Layout: One level Home Equipment: None      Prior Function Prior Level of Function : Independent/Modified Independent  Hand Dominance        Extremity/Trunk Assessment                Communication      Cognition Arousal/Alertness: Awake/alert Behavior During Therapy: WFL for tasks assessed/performed Overall Cognitive Status: Within Functional Limits for tasks assessed                                          General Comments  General comments (skin integrity, edema, etc.): SLS x10+ sec bilat, EC narrow stance x10+ sec, sway WNL    Exercises     Assessment/Plan    PT Assessment All further PT needs can be met in the next venue of care  PT Problem List Decreased balance       PT Treatment Interventions      PT Goals (Current goals can be found in the Care Plan section)  Acute Rehab PT Goals PT Goal Formulation: All assessment and education complete, DC therapy    Frequency     Barriers to discharge        Co-evaluation               AM-PAC PT "6 Clicks" Mobility  Outcome Measure Help needed turning from your back to your side while in a flat bed without using bedrails?: None Help needed moving from lying on your back to sitting on the side of a flat bed without using bedrails?: None Help needed moving to and from a bed to a chair (including a wheelchair)?: None Help needed standing up from a chair using your arms (e.g., wheelchair or bedside chair)?: None Help needed to walk in hospital room?: None Help needed climbing 3-5 steps with a railing? : None 6 Click Score: 24    End of Session   Activity Tolerance: Patient tolerated treatment well;No increased pain Patient left: with family/visitor present Nurse Communication: Mobility status PT Visit Diagnosis: Other abnormalities of gait and mobility (R26.89);Other symptoms and signs involving the nervous system (R29.898)    Time: 1023-1050 PT Time Calculation (min) (ACUTE ONLY): 27 min   Charges:   PT Evaluation $PT Eval Moderate Complexity: 1 Mod         12:21 PM, 11/29/21 Etta Grandchild, PT, DPT Physical Therapist - [facility]  [phone] ([place])    Berino C 11/29/2021, 12:10 PM

## 2021-11-29 NOTE — Progress Notes (Addendum)
PROGRESS NOTE  Trevor Boone    DOB: 01/07/1986, 35 y.o.  MQ:[id number]  PCP: Pcp, No   Code Status: Full Code   DOA: 11/28/2021   LOS: 0  Brief Narrative of Current Hospitalization  Trevor Boone is a 35 y.o. male with a PMH significant for obesity, tobacco use. They presented from home to the ED on 11/28/2021 with intermittent visual abnormalities x1 year. In the ED, it was found that they had COVID positive and white matter changes on head imaging. They had a brain MRI, cervical MRI and MR orbits which showed evidence of demyelinating disease. Neurology was consulted. He was started on high dose IV steroids and supportive care. Patient was admitted to medicine service for further workup and management of visual changes as outlined in detail below.  11/29/21 -stable  Assessment & Plan  Principal Problem:   Dizziness  Demyelinating disease consistent with MS. Denies family history of autoimmune disease.  - neurology following, appreciate recs - continue high dose IV steroids x5 days  Tobacco use - cessation counseling - nicotine replacement PRN  Hyperglycemia- related to steroid use - semglee 15units daily + sSSI  DVT prophylaxis: Place and maintain sequential compression device Start: 11/28/21 1426   Diet:  Diet Orders (From admission, onward)     Start     Ordered   11/28/21 1424  Diet regular Room service appropriate? Yes; Fluid consistency: Thin  Diet effective now       Question Answer Comment  Room service appropriate? Yes   Fluid consistency: Thin      11/28/21 1425            Subjective 11/29/21    Pt reports feeling well. Continues to have visual changes unchanged from baseline.   Disposition Plan & Communication  Patient status: Observation  Admitted From: Home Disposition: Home Anticipated discharge date: 12/25  Family Communication: wife at bedside  Consults, Procedures, Significant Events  Consultants:   neurology  Procedures/significant events:  None  Antimicrobials:  Anti-infectives (From admission, onward)    None       Objective   Vitals:   11/28/21 2257 11/29/21 0454 11/29/21 0641 11/29/21 0644  BP: 101/63 124/77 128/78 124/84  Pulse: (!) 59 81 65   Resp: 16 16    Temp: 97.8 F (36.6 C) 97.9 F (36.6 C)    TempSrc: Oral Oral    SpO2: 96% 94% 98% 97%  Weight:      Height:        Intake/Output Summary (Last 24 hours) at 11/29/2021 0746 Last data filed at 11/28/2021 2333 Gross per 24 hour  Intake 50 ml  Output --  Net 50 ml   Filed Weights   11/28/21 1151  Weight: 111.1 kg    Patient BMI: Body mass index is 39.54 kg/m.   Physical Exam:  General: awake, alert, NAD HEENT: atraumatic, clear conjunctiva, anicteric sclera, MMM, hearing grossly normal Respiratory: normal respiratory effort. Cardiovascular:quick capillary refill  Nervous: A&O x3. Ambulating normally. normal speech Extremities: moves all equally, no edema, normal tone Skin: dry, intact, normal temperature, normal color. No rashes, lesions or ulcers on exposed skin Psychiatry: mildly anxious  Labs   I have personally reviewed following labs and imaging studies Admission on 11/28/2021  Component Date Value Ref Range Status   Sodium 11/28/2021 140  135 - 145 mmol/L Final   Potassium 11/28/2021 3.5  3.5 - 5.1 mmol/L Final   Chloride 11/28/2021 107  98 - 111 mmol/L Final  CO2 11/28/2021 25  22 - 32 mmol/L Final   Glucose, Bld 11/28/2021 90  70 - 99 mg/dL Final   BUN 11/28/2021 9  6 - 20 mg/dL Final   Creatinine, Ser 11/28/2021 0.69  0.61 - 1.24 mg/dL Final   Calcium 11/28/2021 9.5  8.9 - 10.3 mg/dL Final   GFR, Estimated 11/28/2021 >60  >60 mL/min Final   Anion gap 11/28/2021 8  5 - 15 Final   WBC 11/28/2021 11.8 (H)  4.0 - 10.5 K/uL Final   RBC 11/28/2021 4.51  4.22 - 5.81 MIL/uL Final   Hemoglobin 11/28/2021 14.4  13.0 - 17.0 g/dL Final   HCT 11/28/2021 40.5  39.0 - 52.0 % Final   MCV  11/28/2021 89.8  80.0 - 100.0 fL Final   MCH 11/28/2021 31.9  26.0 - 34.0 pg Final   MCHC 11/28/2021 35.6  30.0 - 36.0 g/dL Final   RDW 11/28/2021 12.0  11.5 - 15.5 % Final   Platelets 11/28/2021 317  150 - 400 K/uL Final   nRBC 11/28/2021 0.0  0.0 - 0.2 % Final   Color, Urine 11/28/2021 YELLOW (A)  YELLOW Final   APPearance 11/28/2021 HAZY (A)  CLEAR Final   Specific Gravity, Urine 11/28/2021 1.015  1.005 - 1.030 Final   pH 11/28/2021 6.0  5.0 - 8.0 Final   Glucose, UA 11/28/2021 NEGATIVE  NEGATIVE mg/dL Final   Hgb urine dipstick 11/28/2021 MODERATE (A)  NEGATIVE Final   Bilirubin Urine 11/28/2021 NEGATIVE  NEGATIVE Final   Ketones, ur 11/28/2021 NEGATIVE  NEGATIVE mg/dL Final   Protein, ur 11/28/2021 NEGATIVE  NEGATIVE mg/dL Final   Nitrite 11/28/2021 NEGATIVE  NEGATIVE Final   Leukocytes,Ua 11/28/2021 NEGATIVE  NEGATIVE Final   RBC / HPF 11/28/2021 6-10  0 - 5 RBC/hpf Final   WBC, UA 11/28/2021 0-5  0 - 5 WBC/hpf Final   Bacteria, UA 11/28/2021 NONE SEEN  NONE SEEN Final   Squamous Epithelial / LPF 11/28/2021 NONE SEEN  0 - 5 Final   Mucus 11/28/2021 PRESENT   Final   SARS Coronavirus 2 by RT PCR 11/28/2021 POSITIVE (A)  NEGATIVE Final   Influenza A by PCR 11/28/2021 NEGATIVE  NEGATIVE Final   Influenza B by PCR 11/28/2021 NEGATIVE  NEGATIVE Final   Glucose-Capillary 11/28/2021 82  70 - 99 mg/dL Final    Imaging Studies  CT HEAD WO CONTRAST  Result Date: 11/28/2021 CLINICAL DATA:  Provided history: Dizziness, nonspecific. Additional history provided: Weakness, balance difficulty. EXAM: CT HEAD WITHOUT CONTRAST TECHNIQUE: Contiguous axial images were obtained from the base of the skull through the vertex without intravenous contrast. COMPARISON:  No pertinent prior exams available for comparison. FINDINGS: Brain: Cerebral volume is normal. Mild patchy hypoattenuation within the cerebral white matter, nonspecific. There is no acute intracranial hemorrhage. No demarcated cortical  infarct. No extra-axial fluid collection. No evidence of an intracranial mass. No midline shift. Vascular: No hyperdense vessel. Skull: Normal. Negative for fracture or focal lesion. Sinuses/Orbits: Visualized orbits show no acute finding. Small mucous retention cyst within the left frontal sinus. Trace mucosal thickening within the bilateral ethmoid sinuses. Superimposed small-volume frothy secretions within a posterior left ethmoid air cell. IMPRESSION: No evidence of acute intracranial abnormality. Mild nonspecific cerebral white matter disease. Paranasal sinus disease at the imaged levels, as described. Electronically Signed   By: Kellie Simmering D.O.   On: 11/28/2021 12:36   MR Brain W and Wo Contrast  Result Date: 11/28/2021 CLINICAL DATA:  Initial evaluation for possible  demyelinating disease. Several week history of visual disturbance, balance difficulty. EXAM: MRI HEAD AND ORBITS WITHOUT AND WITH CONTRAST TECHNIQUE: Multiplanar, multiecho pulse sequences of the brain and surrounding structures were obtained without and with intravenous contrast. Multiplanar, multiecho pulse sequences of the orbits and surrounding structures were obtained including fat saturation techniques, before and after intravenous contrast administration. CONTRAST:  68mL GADAVIST GADOBUTROL 1 MMOL/ML IV SOLN COMPARISON:  Comparison made with prior head CT from earlier the same day. FINDINGS: MRI HEAD FINDINGS Brain: Cerebral volume within normal limits. Multiple scattered patchy foci of T2/FLAIR hyperintensity are seen involving the periventricular, deep, and juxta cortical white matter both cerebral hemispheres. Several of these foci are oriented perpendicular to the lateral ventricles. Scattered callososeptal involvement noted. Patchy infratentorial involvement noted as well, with signal changes present about the right dorsal pons and cerebellum. Multiple corresponding T1 black holes. Overall appearance is highly characteristic of  demyelinating disease/multiple sclerosis. Single 6 mm focus of enhancement about a lesion involving the posterior left periventricular/periatrial white matter at the left temporal occipital region is seen, consistent with a focus of active demyelination (series 20, image 10). No other abnormal enhancement. No evidence for acute or subacute infarct. Gray-white matter differentiation maintained. No areas of chronic cortical infarction. No evidence for acute or chronic intracranial hemorrhage. No mass lesion, mass effect or midline shift. No hydrocephalus or extra-axial fluid collection. Pituitary gland suprasellar region normal. Midline structures intact. No other abnormal enhancement. Vascular: Major intracranial vascular flow voids are well maintained. Skull and upper cervical spine: Craniocervical junction within normal limits. Bone marrow signal intensity normal. No scalp soft tissue abnormality. Other: No mastoid effusion.  Inner ear structures grossly normal. MRI ORBITS FINDINGS Orbits: Globes are symmetric in size with normal appearance and morphology bilaterally. Right optic nerve is normal in appearance without intrinsic edema or enhancement. No abnormality about the right optic nerve sheath. On the left, there is subtle irregularity and probable edema involving the left optic nerve just posterior to the left globe (series 4, images 24, 23). Suggestion of associated hazy enhancement within this region (series 8, image 28). Given the patient's symptoms as well as the findings on corresponding brain MRI, findings are highly suspicious for possible optic neuritis. Remainder of the orbital soft tissues within normal limits. Extra-ocular muscles symmetric and normal. Lacrimal glands normal. Intraconal and extraconal fat well-maintained. No abnormality about the orbital apices or cavernous sinus. Optic chiasm normally situated within the suprasellar cistern. Visualized sinuses: Small mucous retention cyst present  at the left frontal sinus. Scattered mucosal thickening noted within the ethmoidal air cells and maxillary sinuses. Visualized paranasal sinuses are otherwise largely clear. Soft tissues: Unremarkable. IMPRESSION: MRI HEAD IMPRESSION: Patchy multifocal T2/FLAIR hyperintensities involving the supratentorial and infratentorial cerebral white matter, most characteristic of demyelinating disease/multiple sclerosis. Single 6 mm focus of enhancement about a lesion involving the posterior left periventricular/periatrial white matter at the left temporoccipital region, consistent with active demyelination. MRI ORBITS IMPRESSION:: Subtle irregularity, edema, and enhancement involving the left optic nerve, suspicious for acute optic neuritis. Correlation with physical exam recommended. Electronically Signed   By: Jeannine Boga M.D.   On: 11/28/2021 19:40   MR Cervical Spine W or Wo Contrast  Result Date: 11/28/2021 CLINICAL DATA:  Multiple sclerosis. EXAM: MRI CERVICAL SPINE WITHOUT AND WITH CONTRAST TECHNIQUE: Multiplanar and multiecho pulse sequences of the cervical spine, to include the craniocervical junction and cervicothoracic junction, were obtained without and with intravenous contrast. CONTRAST:  71mL GADAVIST GADOBUTROL 1 MMOL/ML IV SOLN  COMPARISON:  None. FINDINGS: Alignment: Normal Vertebrae: Negative for fracture or mass Cord: Normal signal morphology.  Normal enhancement of the cord. Posterior Fossa, vertebral arteries, paraspinal tissues: Negative Disc levels: C2-3: Negative C3-4: Negative C4-5: Disc degeneration and mild spurring. Mild spinal stenosis and mild foraminal stenosis bilaterally C5-6: Disc degeneration with diffuse uncinate spurring right greater than left. Moderate right foraminal narrowing. Mild spinal stenosis. C6-7: Disc degeneration with diffuse uncinate spurring. Mild foraminal narrowing bilaterally C7-T1: Negative IMPRESSION: Normal cervical spinal cord without evidence of  demyelinating disease Cervical spondylosis as above. Electronically Signed   By: Marlan Palau M.D.   On: 11/28/2021 19:58   MR ORBITS W WO CONTRAST  Result Date: 11/28/2021 CLINICAL DATA:  Initial evaluation for possible demyelinating disease. Several week history of visual disturbance, balance difficulty. EXAM: MRI HEAD AND ORBITS WITHOUT AND WITH CONTRAST TECHNIQUE: Multiplanar, multiecho pulse sequences of the brain and surrounding structures were obtained without and with intravenous contrast. Multiplanar, multiecho pulse sequences of the orbits and surrounding structures were obtained including fat saturation techniques, before and after intravenous contrast administration. CONTRAST:  56mL GADAVIST GADOBUTROL 1 MMOL/ML IV SOLN COMPARISON:  Comparison made with prior head CT from earlier the same day. FINDINGS: MRI HEAD FINDINGS Brain: Cerebral volume within normal limits. Multiple scattered patchy foci of T2/FLAIR hyperintensity are seen involving the periventricular, deep, and juxta cortical white matter both cerebral hemispheres. Several of these foci are oriented perpendicular to the lateral ventricles. Scattered callososeptal involvement noted. Patchy infratentorial involvement noted as well, with signal changes present about the right dorsal pons and cerebellum. Multiple corresponding T1 black holes. Overall appearance is highly characteristic of demyelinating disease/multiple sclerosis. Single 6 mm focus of enhancement about a lesion involving the posterior left periventricular/periatrial white matter at the left temporal occipital region is seen, consistent with a focus of active demyelination (series 20, image 10). No other abnormal enhancement. No evidence for acute or subacute infarct. Gray-white matter differentiation maintained. No areas of chronic cortical infarction. No evidence for acute or chronic intracranial hemorrhage. No mass lesion, mass effect or midline shift. No hydrocephalus or  extra-axial fluid collection. Pituitary gland suprasellar region normal. Midline structures intact. No other abnormal enhancement. Vascular: Major intracranial vascular flow voids are well maintained. Skull and upper cervical spine: Craniocervical junction within normal limits. Bone marrow signal intensity normal. No scalp soft tissue abnormality. Other: No mastoid effusion.  Inner ear structures grossly normal. MRI ORBITS FINDINGS Orbits: Globes are symmetric in size with normal appearance and morphology bilaterally. Right optic nerve is normal in appearance without intrinsic edema or enhancement. No abnormality about the right optic nerve sheath. On the left, there is subtle irregularity and probable edema involving the left optic nerve just posterior to the left globe (series 4, images 24, 23). Suggestion of associated hazy enhancement within this region (series 8, image 28). Given the patient's symptoms as well as the findings on corresponding brain MRI, findings are highly suspicious for possible optic neuritis. Remainder of the orbital soft tissues within normal limits. Extra-ocular muscles symmetric and normal. Lacrimal glands normal. Intraconal and extraconal fat well-maintained. No abnormality about the orbital apices or cavernous sinus. Optic chiasm normally situated within the suprasellar cistern. Visualized sinuses: Small mucous retention cyst present at the left frontal sinus. Scattered mucosal thickening noted within the ethmoidal air cells and maxillary sinuses. Visualized paranasal sinuses are otherwise largely clear. Soft tissues: Unremarkable. IMPRESSION: MRI HEAD IMPRESSION: Patchy multifocal T2/FLAIR hyperintensities involving the supratentorial and infratentorial cerebral white matter, most characteristic  of demyelinating disease/multiple sclerosis. Single 6 mm focus of enhancement about a lesion involving the posterior left periventricular/periatrial white matter at the left temporoccipital  region, consistent with active demyelination. MRI ORBITS IMPRESSION:: Subtle irregularity, edema, and enhancement involving the left optic nerve, suspicious for acute optic neuritis. Correlation with physical exam recommended. Electronically Signed   By: Jeannine Boga M.D.   On: 11/28/2021 19:40    Medications   Scheduled Meds:  insulin aspart  0-20 Units Subcutaneous TID WC   insulin aspart  0-5 Units Subcutaneous QHS   No recently discontinued medications to reconcile  LOS: 0 days   Time spent: >46min  Ianna Salmela L Aaria Happ, DO [facility] 11/29/2021, 7:46 AM   Available by Epic secure chat 7AM-7PM. If 7PM-7AM, please contact night-coverage Refer to [email or web address] to contact the [facility] Attending or Consulting provider for this pt

## 2021-11-29 NOTE — Evaluation (Signed)
Occupational Therapy Evaluation Patient Details Name: Trevor Boone MRN: [id number] DOB: 04-17-1986 Today's Date: 11/29/2021   History of Present Illness Trevor Boone is a 35yoM 12/20 c dizziness, difficulty walking. Reports onset diplopia and blurred vision 1 year ago. Pt notes ongoing difficulty with depth perception, not much help with glassess prescription. PCP treated pt for "vertigo" dizziness 1 year ago with meclizine, however pt denies any frank vertiginous phenomenon or room spinning sensation. Pt sent here from urgent care, positive CovId (asymptomatic). MRI here showing some cortical changes. subtle irregularity, edema, and enhancement involving the left optic nerve, suspicious for acute optic neuritis.Pt reports sick on 12/11 went to urgent care 12/12 tested (+) COVID. Thanksgiving had fever, vomitting diarrhea; for 2-3 days. Both episodes had exacerbation of his visual and imbalance symptoms.   Clinical Impression   Pt seen for OT evaluation this date. Of note, MRI result posted prior to session, but no notes for interpretation available from attending or neurology- results not discussed with patient. Upon arrival to room, pt seated at EOB. Pt agreeable to OT eval. At baseline, pt is independent in all ADLs/IADLs and functional mobility, works as a [employer], and lives with family in a 1-level house. Pt reports that he has been having increased difficulty cooking and navigating stairs since worsening of diplopia over past few weeks. During visual assessment this date, pt was noted to have nystagmus when tracking laterally, difficulty reading fine print (e.g., items in menu), and performing finger-to-nose coordination tasks. Pt currently performs functional mobility and ADLs MOD-I (increased effort only) and no strength, sensation, or balance deficits observed during evaluation. Upon discharge, recommend outpatient OT services to address impairments noted above. No acute OT skills  identified at this time. OT to sign off.        Recommendations for follow up therapy are one component of a multi-disciplinary discharge planning process, led by the attending physician.  Recommendations may be updated based on patient status, additional functional criteria and insurance authorization.   Follow Up Recommendations  Outpatient OT    Assistance Recommended at Discharge PRN  Functional Status Assessment  Patient has had a recent decline in their functional status and demonstrates the ability to make significant improvements in function in a reasonable and predictable amount of time.  Equipment Recommendations  None recommended by OT       Precautions / Restrictions Precautions Precautions: None Restrictions Weight Bearing Restrictions: No      Mobility Bed Mobility Overal bed mobility: Independent                  Transfers Overall transfer level: Independent                        Balance Overall balance assessment: Independent                                         ADL either performed or assessed with clinical judgement   ADL Overall ADL's : Modified independent                                       General ADL Comments: Able to perform seated dressing, toileting, and standing hand hygiene with increased effort only. Not physical assist required     Vision Baseline Vision/History:  1 Wears glasses (has glasses but reports they do not help) Ability to See in Adequate Light: 1 Impaired (Some difficutly reading fine print in menu. No difficulty reading whiteboard) Patient Visual Report: Diplopia Vision Assessment?: Yes Ocular Range of Motion: Within Functional Limits Tracking/Visual Pursuits: Decreased smoothness of horizontal tracking (some nystagmus noted when tracking laterally to left visual field) Convergence: Within functional limits Visual Fields: No apparent deficits Diplopia Assessment:  Objects split side to side            Pertinent Vitals/Pain Pain Assessment: No/denies pain        Extremity/Trunk Assessment Upper Extremity Assessment Upper Extremity Assessment: RUE deficits/detail;LUE deficits/detail RUE Deficits / Details: Strength & sensation WFL. Requires increased time/effort to perform finger-to-nose coordination assessment. RUE Sensation: WNL RUE Coordination: decreased fine motor LUE Deficits / Details: Strength & sensation WFL. Requires increased time/effort to perform finger-to-nose coordination assessment. LUE Sensation: WNL LUE Coordination: decreased fine motor   Lower Extremity Assessment Lower Extremity Assessment: Overall WFL for tasks assessed       Communication Communication Communication: No difficulties   Cognition Arousal/Alertness: Awake/alert Behavior During Therapy: WFL for tasks assessed/performed Overall Cognitive Status: Within Functional Limits for tasks assessed                                          Exercises Other Exercises Other Exercises: Educated pt on role of OT, POC, and d/c rec, with pt verbalized understanding        Home Living Family/patient expects to be discharged to:: Private residence Living Arrangements: Spouse/significant other Available Help at Discharge: Family (5 children, 3 of which are old enough to help) Type of Home: House Home Access: Stairs to enter [employer] of Steps: 4 Entrance Stairs-Rails: Left Home Layout: One level               Home Equipment: None          Prior Functioning/Environment Prior Level of Function : Independent/Modified Independent             Mobility Comments: Pt independent without AD for functional mobility. Endorses some difficulty with stairs recently and reports 1 fall last month ADLs Comments: Pt independent with ADLs at baseline. Pt works as a Naval architect. Endorses some difficulty with cooking recently         OT Problem List: Impaired vision/perception;Decreased coordination;Impaired UE functional use      OT Treatment/Interventions: Self-care/ADL training;Therapeutic exercise;Neuromuscular education;Energy conservation;Therapeutic activities;Visual/perceptual remediation/compensation;Balance training;Patient/family education    OT Goals(Current goals can be found in the care plan section) Acute Rehab OT Goals Patient Stated Goal: to return home OT Goal Formulation: All assessment and education complete, DC therapy ADL Goals  OT Frequency:      AM-PAC OT "6 Clicks" Daily Activity     Outcome Measure Help from another person eating meals?: None Help from another person taking care of personal grooming?: None Help from another person toileting, which includes using toliet, bedpan, or urinal?: None Help from another person bathing (including washing, rinsing, drying)?: None Help from another person to put on and taking off regular upper body clothing?: None Help from another person to put on and taking off regular lower body clothing?: None 6 Click Score: 24   End of Session Nurse Communication: Mobility status  Activity Tolerance: Patient tolerated treatment well Patient left: in bed;with call bell/phone within reach  OT  Visit Diagnosis: Low vision, both eyes (H54.2);Other symptoms and signs involving the nervous system (R29.898)                Time: 1420-1446 OT Time Calculation (min): 26 min Charges:  OT General Charges $OT Visit: 1 Visit OT Evaluation $OT Eval Moderate Complexity: 1 Mod OT Treatments $Therapeutic Activity: 8-22 mins  Matthew Folks, OTR/L [facility] [phone]

## 2021-11-29 NOTE — Plan of Care (Signed)
  Problem: Activity: Goal: Risk for activity intolerance will decrease Outcome: Progressing   

## 2021-11-30 ENCOUNTER — Inpatient Hospital Stay: Payer: BC Managed Care – PPO

## 2021-11-30 DIAGNOSIS — G35 Multiple sclerosis: Principal | ICD-10-CM

## 2021-11-30 DIAGNOSIS — H469 Unspecified optic neuritis: Secondary | ICD-10-CM | POA: Diagnosis not present

## 2021-11-30 DIAGNOSIS — H5121 Internuclear ophthalmoplegia, right eye: Secondary | ICD-10-CM

## 2021-11-30 DIAGNOSIS — R278 Other lack of coordination: Secondary | ICD-10-CM | POA: Diagnosis not present

## 2021-11-30 LAB — HEPATITIS PANEL, ACUTE
HCV Ab: NONREACTIVE
Hep A IgM: NONREACTIVE
Hep B C IgM: NONREACTIVE
Hepatitis B Surface Ag: NONREACTIVE

## 2021-11-30 LAB — GLUCOSE, CAPILLARY
Glucose-Capillary: 124 mg/dL — ABNORMAL HIGH (ref 70–99)
Glucose-Capillary: 132 mg/dL — ABNORMAL HIGH (ref 70–99)
Glucose-Capillary: 192 mg/dL — ABNORMAL HIGH (ref 70–99)
Glucose-Capillary: 233 mg/dL — ABNORMAL HIGH (ref 70–99)

## 2021-11-30 LAB — CBC WITH DIFFERENTIAL/PLATELET
Abs Immature Granulocytes: 0.2 10*3/uL — ABNORMAL HIGH (ref 0.00–0.07)
Basophils Absolute: 0 10*3/uL (ref 0.0–0.1)
Basophils Relative: 0 %
Eosinophils Absolute: 0 10*3/uL (ref 0.0–0.5)
Eosinophils Relative: 0 %
HCT: 40.2 % (ref 39.0–52.0)
Hemoglobin: 13.7 g/dL (ref 13.0–17.0)
Immature Granulocytes: 1 %
Lymphocytes Relative: 9 %
Lymphs Abs: 1.6 10*3/uL (ref 0.7–4.0)
MCH: 31.4 pg (ref 26.0–34.0)
MCHC: 34.1 g/dL (ref 30.0–36.0)
MCV: 92 fL (ref 80.0–100.0)
Monocytes Absolute: 0.4 10*3/uL (ref 0.1–1.0)
Monocytes Relative: 2 %
Neutro Abs: 15 10*3/uL — ABNORMAL HIGH (ref 1.7–7.7)
Neutrophils Relative %: 88 %
Platelets: 333 10*3/uL (ref 150–400)
RBC: 4.37 MIL/uL (ref 4.22–5.81)
RDW: 12.2 % (ref 11.5–15.5)
WBC: 17.2 10*3/uL — ABNORMAL HIGH (ref 4.0–10.5)
nRBC: 0 % (ref 0.0–0.2)

## 2021-11-30 LAB — COMPREHENSIVE METABOLIC PANEL
ALT: 58 U/L — ABNORMAL HIGH (ref 0–44)
AST: 27 U/L (ref 15–41)
Albumin: 3.9 g/dL (ref 3.5–5.0)
Alkaline Phosphatase: 58 U/L (ref 38–126)
Anion gap: 7 (ref 5–15)
BUN: 15 mg/dL (ref 6–20)
CO2: 22 mmol/L (ref 22–32)
Calcium: 9.1 mg/dL (ref 8.9–10.3)
Chloride: 107 mmol/L (ref 98–111)
Creatinine, Ser: 0.76 mg/dL (ref 0.61–1.24)
GFR, Estimated: >60 mL/min (ref >60)
Glucose, Bld: 151 mg/dL — ABNORMAL HIGH (ref 70–99)
Potassium: 4 mmol/L (ref 3.5–5.1)
Sodium: 136 mmol/L (ref 135–145)
Total Bilirubin: 0.9 mg/dL (ref 0.3–1.2)
Total Protein: 7.2 g/dL (ref 6.5–8.1)

## 2021-11-30 LAB — VITAMIN D 25 HYDROXY (VIT D DEFICIENCY, FRACTURES): Vit D, 25-Hydroxy: 20.09 ng/mL — ABNORMAL LOW (ref 30–100)

## 2021-11-30 MED ORDER — SODIUM CHLORIDE 0.9 % IV SOLN
1000.0000 mg | Freq: Every day | INTRAVENOUS | Status: AC
  Administered 2021-11-30 – 2021-12-01 (×2): 1000 mg via INTRAVENOUS
  Filled 2021-11-30 (×2): qty 16

## 2021-11-30 MED ORDER — SODIUM CHLORIDE 0.9 % IV SOLN
1000.0000 mg | Freq: Once | INTRAVENOUS | Status: AC
  Administered 2021-12-02: 14:00:00 1000 mg via INTRAVENOUS
  Filled 2021-11-30: qty 16

## 2021-11-30 MED ORDER — GADOBUTROL 1 MMOL/ML IV SOLN
10.0000 mL | Freq: Once | INTRAVENOUS | Status: AC | PRN
  Administered 2021-11-30: 20:00:00 10 mL via INTRAVENOUS

## 2021-11-30 NOTE — Plan of Care (Signed)

## 2021-11-30 NOTE — Consult Note (Signed)
NEUROLOGY CONSULTATION NOTE   Date of service: November 30, 2021 Patient Name: Trevor Boone MRN:  [id number] DOB:  [birth year] Reason for consult: multiple sclerosis new dx Requesting physician: Dr. Jamelle Rushing _ _ _   _ __   _ __ _ _  __ __   _ __   __ _  History of Present Illness   35 yo man with hx tobacco abuse who presented to ED yesterday with intermittent diplopia. States blurry vision in R eye started about one year ago, was seen by optometrist and given glasses which did not help. Over the past few weeks he has also developed decreased visual acuity in L eye. No pain with eye movement. Also for same time period noticed difficulty with balance occasionally impairing ambulation, and poor depth perception. Reports sx get worse when he has URI. He developed chronic vertigo and received meclizine from PCP with no benefit. He was seen in urgent care yesterday and referred to ED. Dr. Sidney Ace called me and said she was concerned that he had a R INO. This was no longer present on my examination today although he has marked L-beating nystagmus on Lward gaze on my exam. I recommended the following imaging  MRI brain wwo: multifocal T2/FLAIR hyperintensities involving supratentorial white matter, most periventricular and some adjacent to corpus callosum, patchy and sometimes ovioid in appearance c/f demyelinating disease. Single 71mm lesion L temporoccipital region enhances. Nonenhancing white matter lesions in L>R cerebellum.  MRI orbits wwo: irregularity, edema, and enhancement of L optic nerve c/f acute optic neuritis  MRI c spine wwo: cervical spondylosis, no e/o demyelinating lesions in cord  CNS imaging personally reviewed.  Pt admitted and started on 1000mg  IV solumedrol x5 days for MS exacerbation.     ROS   Per HPI: all other systems reviewed and are negative  Past History   I have reviewed the following:  No past medical history on file. No past surgical history on  file. No family history on file. Social History   Socioeconomic History   Marital status: Married    Spouse name: Not on file   Number of children: Not on file   Years of education: Not on file   Highest education level: Not on file  Occupational History   Not on file  Tobacco Use   Smoking status: Every Day    Packs/day: 0.33    Types: Cigarettes   Smokeless tobacco: Never  Substance and Sexual Activity   Alcohol use: Not on file   Drug use: Not on file   Sexual activity: Not on file  Other Topics Concern   Not on file  Social History Narrative   Not on file   Social Determinants of Health   Financial Resource Strain: Not on file  Food Insecurity: Not on file  Transportation Needs: Not on file  Physical Activity: Not on file  Stress: Not on file  Social Connections: Not on file   No Known Allergies  Medications   No medications prior to admission.      Current Facility-Administered Medications:    insulin aspart (novoLOG) injection 0-20 Units, 0-20 Units, Subcutaneous, TID WC, Anwar, Shayan S, DO, 4 Units at 11/29/21 1815   insulin glargine-yfgn (SEMGLEE) injection 15 Units, 15 Units, Subcutaneous, Daily, 12/01/21, MD, 15 Units at 11/29/21 1815   methylPREDNISolone sodium succinate (SOLU-MEDROL) 1,000 mg in sodium chloride 0.9 % 50 mL IVPB, 1,000 mg, Intravenous, QHS, 12/01/21, RPH, Last Rate: 66 mL/hr  at 11/29/21 2151, 1,000 mg at 11/29/21 2151   traZODone (DESYREL) tablet 50 mg, 50 mg, Oral, QHS PRN, Verdia Kuba, DO  Vitals   Vitals:   11/29/21 0813 11/29/21 1218 11/29/21 1718 11/29/21 2003  BP: 131/78 133/74 120/65 122/70  Pulse: 89 (!) 104 (!) 106 87  Resp: 16 16 16 19   Temp: 97.6 F (36.4 C) 98 F (36.7 C) 97.8 F (36.6 C) 97.7 F (36.5 C)  TempSrc: Oral Oral Oral   SpO2: 98% 94% 96% 95%  Weight:      Height:         Body mass index is 39.54 kg/m.  Physical Exam   Physical Exam Gen: A&O x4,  NAD HEENT: Atraumatic, normocephalic;mucous membranes moist; oropharynx clear, tongue without atrophy or fasciculations. Neck: Supple, trachea midline. Resp: CTAB, no w/r/r CV: RRR, no m/g/r; nml S1 and S2. 2+ symmetric peripheral pulses. Abd: soft/NT/ND; nabs x 4 quad Extrem: Nml bulk; no cyanosis, clubbing, or edema.  Neuro: *MS: A&O x4. Follows multi-step commands.  *Speech: fluid, nondysarthric, able to name and repeat *CN:    I: Deferred   II,III: PERRLA, VFF by confrontation, optic discs unable to be visualized 2/2 pupillary constriction   III,IV,VI: EOMI w/ marked Lbeating nystagmus on Lward gaze   V: Sensation intact from V1 to V3 to LT   VII: Eyelid closure was full.  Smile symmetric.   VIII: Hearing intact to voice   IX,X: Voice normal, palate elevates symmetrically    XI: SCM/trap 5/5 bilat   XII: Tongue protrudes midline, no atrophy or fasciculations  *Motor:   Normal bulk.  No tremor, rigidity or bradykinesia. No pronator drift.   Strength: Dlt Bic Tri WrE WrF FgS Gr HF KnF KnE PlF DoF    Left 5 5 5 5 5 5 5 5 5 5 5 5     Right 5 5 5 5 5 5 5 5 5 5 5 5   *Sensory: Intact to light touch, pinprick, temperature vibration throughout. Symmetric. Propioception intact bilat.  No double-simultaneous extinction.  *Coordination:  L>R dysmetria on FNF *Reflexes:  2+ and symmetric throughout without clonus; toes down-going bilat *Gait: deferred   Labs   CBC:  Recent Labs  Lab 11/28/21 1157  WBC 11.8*  HGB 14.4  HCT 40.5  MCV 89.8  PLT 317    Basic Metabolic Panel:  Lab Results  Component Value Date   NA 140 11/28/2021   K 3.5 11/28/2021   CO2 25 11/28/2021   GLUCOSE 90 11/28/2021   BUN 9 11/28/2021   CREATININE 0.69 11/28/2021   CALCIUM 9.5 11/28/2021   GFRNONAA >60 11/28/2021   GFRAA >60 01/16/2019   Lipid Panel: No results found for: LDLCALC HgbA1c: No results found for: HGBA1C Urine Drug Screen: No results found for: LABOPIA, COCAINSCRNUR, LABBENZ, AMPHETMU,  THCU, LABBARB  Alcohol Level No results found for: ETH   Impression   35 yo man with hx tobacco abuse who presented to ED yesterday with new onset L blurry vision and intermittent diplopia. Reports the following neurologic sx that developed over the past 12 mos: R eye blurry vision, balance impairment, poor depth perception, vertigo. MRI brain wwo shows multifocal T2/FLAIR hyperintensities involving supratentorial white matter, most periventricular and some adjacent to corpus callosum, patchy and sometimes ovioid in appearance c/f demyelinating disease. Single 38mm lesion L temporoccipital region enhances. Nonenhancing white matter lesions in L>R cerebellum. MRI orbits wwo c/f acute optic neuritis. No lesions in cervical spinal cord. Presentation and  imaging c/w new dx multiple sclerosis.  Recommendations   - Agree with IV solumedrol 1000mg  q 24 hrs x5 days (12/02/21 PM) - MRI t spine wwo tomorrow - Baseline labwork prior to DMARD therapy: CMP, CBC w/ diff, JC virus, hepatitis panels, quantiferon gold - Check vit D level - Pt will require referral to MS specialist at hospital discharge; DMARD will be selected and started as outpatient - Ophthalmology referral at hospital discharge  I spent 45 minutes today with patient and his significant other discussing tx, acute treatment plan, and DMARD therapy. I showed them his MRI images and answered their questions to the best of my ability. I will f/u tomorrow.  ______________________________________________________________________   Thank you for the opportunity to take part in the care of this patient. If you have any further questions, please contact the neurology consultation attending.  Signed,  12/04/21, MD [facility] [phone]  If 7pm- 7am, please page neurology on call as listed in AMION.

## 2021-11-30 NOTE — Progress Notes (Signed)
Neurology progress note  S: Patient reports slight improvement in L eye blurriness and diplopia. No new neurologic complaints today. Tolerating solumedrol well.  Imaging:  MRI brain wwo: multifocal T2/FLAIR hyperintensities involving supratentorial white matter, most periventricular and some adjacent to corpus callosum, patchy and sometimes ovioid in appearance c/f demyelinating disease. Single 40mm lesion L temporoccipital region enhances. Nonenhancing white matter lesions in L>R cerebellum.   MRI orbits wwo: irregularity, edema, and enhancement of L optic nerve c/f acute optic neuritis   MRI c spine wwo: cervical spondylosis, no e/o demyelinating lesions in cord   CNS imaging personally reviewed.  MRI t spine wwo pending  Exam  Vitals:   11/30/21 1151 11/30/21 1539  BP: 131/76 129/72  Pulse: 79 79  Resp: 20 20  Temp: 97.9 F (36.6 C) 98.5 F (36.9 C)  SpO2: 97% 96%   Physical Exam Gen: A&O x4, NAD HEENT: Atraumatic, normocephalic;mucous membranes moist; oropharynx clear, tongue without atrophy or fasciculations. Neck: Supple, trachea midline. Resp: CTAB, no w/r/r CV: RRR, no m/g/r; nml S1 and S2. 2+ symmetric peripheral pulses. Abd: soft/NT/ND; nabs x 4 quad Extrem: Nml bulk; no cyanosis, clubbing, or edema.   Neuro: *MS: A&O x4. Follows multi-step commands.  *Speech: fluid, nondysarthric, able to name and repeat *CN:    I: Deferred   II,III: PERRLA, L APD, VFF by confrontation, optic discs unable to be visualized 2/2 pupillary constriction   III,IV,VI: EOMI w/ Lbeating nystagmus on Lward gaze (less prominent than yesterday)   V: Sensation intact from V1 to V3 to LT   VII: Eyelid closure was full.  Smile symmetric.   VIII: Hearing intact to voice   IX,X: Voice normal, palate elevates symmetrically    XI: SCM/trap 5/5 bilat   XII: Tongue protrudes midline, no atrophy or fasciculations  *Motor:   Normal bulk.  No tremor, rigidity or bradykinesia. No pronator drift.    Strength: Dlt Bic Tri WrE WrF FgS Gr HF KnF KnE PlF DoF    Left 5 5 5 5 5 5 5 5 5 5 5 5     Right 5 5 5 5 5 5 5 5 5 5 5 5   *Sensory: Intact to light touch, pinprick, temperature vibration throughout. Symmetric. Propioception intact bilat.  No double-simultaneous extinction.  *Coordination:  L>R dysmetria on FNF *Reflexes:  2+ and symmetric throughout without clonus; toes down-going bilat *Gait: deferred  A/P: 35 yo man with hx tobacco abuse who presented to ED 11/28/21 with new onset L blurry vision and intermittent diplopia. Reports the following neurologic sx that developed over the past 12 mos: R eye blurry vision, balance impairment, poor depth perception, vertigo. MRI brain wwo shows multifocal T2/FLAIR hyperintensities involving supratentorial white matter, most periventricular and some adjacent to corpus callosum, patchy and sometimes ovioid in appearance c/f demyelinating disease. Single 23mm lesion L temporoccipital region enhances. Nonenhancing white matter lesions in L>R cerebellum. MRI orbits wwo c/f acute optic neuritis. No lesions in cervical spinal cord. Presentation and imaging c/w new dx multiple sclerosis.  - Continue IV solumedrol 1000mg  q 24 hrs x5 days (end date 12/02/21). 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 He says he can work in [employer] at his job in the meantime. We can provide documentation to his work if needed per his request.  [name], MD [facility] [phone]  If 7pm- 7am, please page neurology on call as listed in AMION.

## 2021-11-30 NOTE — Progress Notes (Addendum)
PROGRESS NOTE  Trevor Boone    DOB: [birth year], 35 y.o.  CI:[id number]  PCP: Pcp, No   Code Status: Full Code   DOA: 11/28/2021   LOS: 1  Brief Narrative of Current Hospitalization  Trevor Boone is a 35 y.o. male with a PMH significant for obesity, tobacco use. They presented from home to the ED on 11/28/2021 with intermittent visual abnormalities x1 year. In the ED, it was found that they had COVID positive and white matter changes on head imaging. They had a brain MRI, cervical MRI and MR orbits which showed evidence of demyelinating disease. Neurology was consulted. He was started on high dose IV steroids and supportive care. Patient was admitted to medicine service for further workup and management of visual changes as outlined in detail below.  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 Denies family history of autoimmune disease.  - neurology following, appreciate recs  - labs per neurology pending today as precursor for starting DMARD therapy. - continue high dose IV steroids x5 days - MRI t-spine today  Tobacco use - cessation counseling - nicotine replacement PRN  Hyperglycemia- related to steroid use - semglee 15units daily + sSSI  DVT prophylaxis:Place and maintain sequential compression device Start: 11/28/21 1426 and ambulating frequently  Diet:  Diet Orders (From admission, onward)     Start     Ordered   11/28/21 1424  Diet regular Room service appropriate? Yes; Fluid consistency: Thin  Diet effective now       Question Answer Comment  Room service appropriate? Yes   Fluid consistency: Thin      11/28/21 1425            Subjective 11/30/21    Pt reports feeling well. He states that his vision is improved.   Disposition Plan & Communication  Patient  status: Inpatient  Admitted From: Home Disposition: Home Anticipated discharge date: 12/25  Family Communication: wife at bedside  Consults, Procedures, Significant Events  Consultants:  neurology  Procedures/significant events:  None  Antimicrobials:  Anti-infectives (From admission, onward)    None       Objective   Vitals:   11/29/21 1218 11/29/21 1718 11/29/21 2003 11/30/21 0450  BP: 133/74 120/65 122/70 119/76  Pulse: (!) 104 (!) 106 87 74  Resp: 16 16 19 17   Temp: 98 F (36.7 C) 97.8 F (36.6 C) 97.7 F (36.5 C) 97.7 F (36.5 C)  TempSrc: Oral Oral    SpO2: 94% 96% 95% 96%  Weight:      Height:        Intake/Output Summary (Last 24 hours) at 11/30/2021 0744 Last data filed at 11/29/2021 2250 Gross per 24 hour  Intake 100 ml  Output --  Net 100 ml    Filed Weights   11/28/21 1151  Weight: 111.1 kg    Patient BMI: Body mass index is 39.54 kg/m.   Physical Exam:  General: awake, alert, NAD HEENT: atraumatic, clear conjunctiva, anicteric sclera, MMM, hearing grossly normal Respiratory: normal respiratory effort. Cardiovascular:quick capillary refill  Nervous: A&O x3. Ambulating normally. normal speech Extremities: moves all equally, no edema, normal tone Skin: dry, intact, normal temperature, normal color. No rashes, lesions or ulcers on exposed skin Psychiatry: mildly anxious  Labs   I have personally reviewed following labs and imaging studies  Admission on 11/28/2021  Component Date Value Ref Range Status   Sodium 11/28/2021 140  135 - 145 mmol/L Final   Potassium 11/28/2021 3.5  3.5 - 5.1 mmol/L Final   Chloride 11/28/2021 107  98 - 111 mmol/L Final   CO2 11/28/2021 25  22 - 32 mmol/L Final   Glucose, Bld 11/28/2021 90  70 - 99 mg/dL Final   BUN 11/28/2021 9  6 - 20 mg/dL Final   Creatinine, Ser 11/28/2021 0.69  0.61 - 1.24 mg/dL Final   Calcium 11/28/2021 9.5  8.9 - 10.3 mg/dL Final   GFR, Estimated 11/28/2021 >60  >60 mL/min Final    Anion gap 11/28/2021 8  5 - 15 Final   WBC 11/28/2021 11.8 (H)  4.0 - 10.5 K/uL Final   RBC 11/28/2021 4.51  4.22 - 5.81 MIL/uL Final   Hemoglobin 11/28/2021 14.4  13.0 - 17.0 g/dL Final   HCT 11/28/2021 40.5  39.0 - 52.0 % Final   MCV 11/28/2021 89.8  80.0 - 100.0 fL Final   MCH 11/28/2021 31.9  26.0 - 34.0 pg Final   MCHC 11/28/2021 35.6  30.0 - 36.0 g/dL Final   RDW 11/28/2021 12.0  11.5 - 15.5 % Final   Platelets 11/28/2021 317  150 - 400 K/uL Final   nRBC 11/28/2021 0.0  0.0 - 0.2 % Final   Color, Urine 11/28/2021 YELLOW (A)  YELLOW Final   APPearance 11/28/2021 HAZY (A)  CLEAR Final   Specific Gravity, Urine 11/28/2021 1.015  1.005 - 1.030 Final   pH 11/28/2021 6.0  5.0 - 8.0 Final   Glucose, UA 11/28/2021 NEGATIVE  NEGATIVE mg/dL Final   Hgb urine dipstick 11/28/2021 MODERATE (A)  NEGATIVE Final   Bilirubin Urine 11/28/2021 NEGATIVE  NEGATIVE Final   Ketones, ur 11/28/2021 NEGATIVE  NEGATIVE mg/dL Final   Protein, ur 11/28/2021 NEGATIVE  NEGATIVE mg/dL Final   Nitrite 11/28/2021 NEGATIVE  NEGATIVE Final   Leukocytes,Ua 11/28/2021 NEGATIVE  NEGATIVE Final   RBC / HPF 11/28/2021 6-10  0 - 5 RBC/hpf Final   WBC, UA 11/28/2021 0-5  0 - 5 WBC/hpf Final   Bacteria, UA 11/28/2021 NONE SEEN  NONE SEEN Final   Squamous Epithelial / LPF 11/28/2021 NONE SEEN  0 - 5 Final   Mucus 11/28/2021 PRESENT   Final   SARS Coronavirus 2 by RT PCR 11/28/2021 POSITIVE (A)  NEGATIVE Final   Influenza A by PCR 11/28/2021 NEGATIVE  NEGATIVE Final   Influenza B by PCR 11/28/2021 NEGATIVE  NEGATIVE Final   HIV Screen 4th Generation wRfx 11/29/2021 Non Reactive  Non Reactive Final   Glucose-Capillary 11/28/2021 82  70 - 99 mg/dL Final   Glucose-Capillary 11/29/2021 210 (H)  70 - 99 mg/dL Final   Glucose-Capillary 11/29/2021 211 (H)  70 - 99 mg/dL Final   Glucose-Capillary 11/29/2021 198 (H)  70 - 99 mg/dL Final   Glucose-Capillary 11/29/2021 166 (H)  70 - 99 mg/dL Final   Sodium 11/30/2021 136  135 -  145 mmol/L Final   Potassium 11/30/2021 4.0  3.5 - 5.1 mmol/L Final   Chloride 11/30/2021 107  98 - 111 mmol/L Final   CO2 11/30/2021 22  22 - 32 mmol/L Final   Glucose, Bld 11/30/2021 151 (H)  70 - 99 mg/dL Final   BUN 11/30/2021 15  6 - 20 mg/dL Final   Creatinine, Ser 11/30/2021 0.76  0.61 - 1.24 mg/dL Final   Calcium 11/30/2021 9.1  8.9 - 10.3 mg/dL Final  Total Protein 11/30/2021 7.2  6.5 - 8.1 g/dL Final   Albumin 11/30/2021 3.9  3.5 - 5.0 g/dL Final   AST 11/30/2021 27  15 - 41 U/L Final   ALT 11/30/2021 58 (H)  0 - 44 U/L Final   Alkaline Phosphatase 11/30/2021 58  38 - 126 U/L Final   Total Bilirubin 11/30/2021 0.9  0.3 - 1.2 mg/dL Final   GFR, Estimated 11/30/2021 >60  >60 mL/min Final   Anion gap 11/30/2021 7  5 - 15 Final   WBC 11/30/2021 17.2 (H)  4.0 - 10.5 K/uL Final   RBC 11/30/2021 4.37  4.22 - 5.81 MIL/uL Final   Hemoglobin 11/30/2021 13.7  13.0 - 17.0 g/dL Final   HCT 11/30/2021 40.2  39.0 - 52.0 % Final   MCV 11/30/2021 92.0  80.0 - 100.0 fL Final   MCH 11/30/2021 31.4  26.0 - 34.0 pg Final   MCHC 11/30/2021 34.1  30.0 - 36.0 g/dL Final   RDW 11/30/2021 12.2  11.5 - 15.5 % Final   Platelets 11/30/2021 333  150 - 400 K/uL Final   nRBC 11/30/2021 0.0  0.0 - 0.2 % Final   Neutrophils Relative % 11/30/2021 88  % Final   Neutro Abs 11/30/2021 15.0 (H)  1.7 - 7.7 K/uL Final   Lymphocytes Relative 11/30/2021 9  % Final   Lymphs Abs 11/30/2021 1.6  0.7 - 4.0 K/uL Final   Monocytes Relative 11/30/2021 2  % Final   Monocytes Absolute 11/30/2021 0.4  0.1 - 1.0 K/uL Final   Eosinophils Relative 11/30/2021 0  % Final   Eosinophils Absolute 11/30/2021 0.0  0.0 - 0.5 K/uL Final   Basophils Relative 11/30/2021 0  % Final   Basophils Absolute 11/30/2021 0.0  0.0 - 0.1 K/uL Final   Immature Granulocytes 11/30/2021 1  % Final   Abs Immature Granulocytes 11/30/2021 0.20 (H)  0.00 - 0.07 K/uL Final    Imaging Studies  CT HEAD WO CONTRAST  Result Date: 11/28/2021 CLINICAL  DATA:  Provided history: Dizziness, nonspecific. Additional history provided: Weakness, balance difficulty. EXAM: CT HEAD WITHOUT CONTRAST TECHNIQUE: Contiguous axial images were obtained from the base of the skull through the vertex without intravenous contrast. COMPARISON:  No pertinent prior exams available for comparison. FINDINGS: Brain: Cerebral volume is normal. Mild patchy hypoattenuation within the cerebral white matter, nonspecific. There is no acute intracranial hemorrhage. No demarcated cortical infarct. No extra-axial fluid collection. No evidence of an intracranial mass. No midline shift. Vascular: No hyperdense vessel. Skull: Normal. Negative for fracture or focal lesion. Sinuses/Orbits: Visualized orbits show no acute finding. Small mucous retention cyst within the left frontal sinus. Trace mucosal thickening within the bilateral ethmoid sinuses. Superimposed small-volume frothy secretions within a posterior left ethmoid air cell. IMPRESSION: No evidence of acute intracranial abnormality. Mild nonspecific cerebral white matter disease. Paranasal sinus disease at the imaged levels, as described. Electronically Signed   By: Kellie Simmering D.O.   On: 11/28/2021 12:36   MR Brain W and Wo Contrast  Result Date: 11/28/2021 CLINICAL DATA:  Initial evaluation for possible demyelinating disease. Several week history of visual disturbance, balance difficulty. EXAM: MRI HEAD AND ORBITS WITHOUT AND WITH CONTRAST TECHNIQUE: Multiplanar, multiecho pulse sequences of the brain and surrounding structures were obtained without and with intravenous contrast. Multiplanar, multiecho pulse sequences of the orbits and surrounding structures were obtained including fat saturation techniques, before and after intravenous contrast administration. CONTRAST:  10mL GADAVIST GADOBUTROL 1 MMOL/ML IV SOLN COMPARISON:  Comparison made with prior head CT from earlier the same day. FINDINGS: MRI HEAD FINDINGS Brain: Cerebral  volume within normal limits. Multiple scattered patchy foci of T2/FLAIR hyperintensity are seen involving the periventricular, deep, and juxta cortical white matter both cerebral hemispheres. Several of these foci are oriented perpendicular to the lateral ventricles. Scattered callososeptal involvement noted. Patchy infratentorial involvement noted as well, with signal changes present about the right dorsal pons and cerebellum. Multiple corresponding T1 black holes. Overall appearance is highly characteristic of demyelinating disease/multiple sclerosis. Single 6 mm focus of enhancement about a lesion involving the posterior left periventricular/periatrial white matter at the left temporal occipital region is seen, consistent with a focus of active demyelination (series 20, image 10). No other abnormal enhancement. No evidence for acute or subacute infarct. Gray-white matter differentiation maintained. No areas of chronic cortical infarction. No evidence for acute or chronic intracranial hemorrhage. No mass lesion, mass effect or midline shift. No hydrocephalus or extra-axial fluid collection. Pituitary gland suprasellar region normal. Midline structures intact. No other abnormal enhancement. Vascular: Major intracranial vascular flow voids are well maintained. Skull and upper cervical spine: Craniocervical junction within normal limits. Bone marrow signal intensity normal. No scalp soft tissue abnormality. Other: No mastoid effusion.  Inner ear structures grossly normal. MRI ORBITS FINDINGS Orbits: Globes are symmetric in size with normal appearance and morphology bilaterally. Right optic nerve is normal in appearance without intrinsic edema or enhancement. No abnormality about the right optic nerve sheath. On the left, there is subtle irregularity and probable edema involving the left optic nerve just posterior to the left globe (series 4, images 24, 23). Suggestion of associated hazy enhancement within this region  (series 8, image 28). Given the patient's symptoms as well as the findings on corresponding brain MRI, findings are highly suspicious for possible optic neuritis. Remainder of the orbital soft tissues within normal limits. Extra-ocular muscles symmetric and normal. Lacrimal glands normal. Intraconal and extraconal fat well-maintained. No abnormality about the orbital apices or cavernous sinus. Optic chiasm normally situated within the suprasellar cistern. Visualized sinuses: Small mucous retention cyst present at the left frontal sinus. Scattered mucosal thickening noted within the ethmoidal air cells and maxillary sinuses. Visualized paranasal sinuses are otherwise largely clear. Soft tissues: Unremarkable. IMPRESSION: MRI HEAD IMPRESSION: Patchy multifocal T2/FLAIR hyperintensities involving the supratentorial and infratentorial cerebral white matter, most characteristic of demyelinating disease/multiple sclerosis. Single 6 mm focus of enhancement about a lesion involving the posterior left periventricular/periatrial white matter at the left temporoccipital region, consistent with active demyelination. MRI ORBITS IMPRESSION:: Subtle irregularity, edema, and enhancement involving the left optic nerve, suspicious for acute optic neuritis. Correlation with physical exam recommended. Electronically Signed   By: Rise Mu M.D.   On: 11/28/2021 19:40   MR Cervical Spine W or Wo Contrast  Result Date: 11/28/2021 CLINICAL DATA:  Multiple sclerosis. EXAM: MRI CERVICAL SPINE WITHOUT AND WITH CONTRAST TECHNIQUE: Multiplanar and multiecho pulse sequences of the cervical spine, to include the craniocervical junction and cervicothoracic junction, were obtained without and with intravenous contrast. CONTRAST:  50mL GADAVIST GADOBUTROL 1 MMOL/ML IV SOLN COMPARISON:  None. FINDINGS: Alignment: Normal Vertebrae: Negative for fracture or mass Cord: Normal signal morphology.  Normal enhancement of the cord. Posterior  Fossa, vertebral arteries, paraspinal tissues: Negative Disc levels: C2-3: Negative C3-4: Negative C4-5: Disc degeneration and mild spurring. Mild spinal stenosis and mild foraminal stenosis bilaterally C5-6: Disc degeneration with diffuse uncinate spurring right greater than left. Moderate right foraminal narrowing. Mild spinal stenosis. C6-7: Disc  degeneration with diffuse uncinate spurring. Mild foraminal narrowing bilaterally C7-T1: Negative IMPRESSION: Normal cervical spinal cord without evidence of demyelinating disease Cervical spondylosis as above. Electronically Signed   By: Franchot Gallo M.D.   On: 11/28/2021 19:58   MR ORBITS W WO CONTRAST  Result Date: 11/28/2021 CLINICAL DATA:  Initial evaluation for possible demyelinating disease. Several week history of visual disturbance, balance difficulty. EXAM: MRI HEAD AND ORBITS WITHOUT AND WITH CONTRAST TECHNIQUE: Multiplanar, multiecho pulse sequences of the brain and surrounding structures were obtained without and with intravenous contrast. Multiplanar, multiecho pulse sequences of the orbits and surrounding structures were obtained including fat saturation techniques, before and after intravenous contrast administration. CONTRAST:  55mL GADAVIST GADOBUTROL 1 MMOL/ML IV SOLN COMPARISON:  Comparison made with prior head CT from earlier the same day. FINDINGS: MRI HEAD FINDINGS Brain: Cerebral volume within normal limits. Multiple scattered patchy foci of T2/FLAIR hyperintensity are seen involving the periventricular, deep, and juxta cortical white matter both cerebral hemispheres. Several of these foci are oriented perpendicular to the lateral ventricles. Scattered callososeptal involvement noted. Patchy infratentorial involvement noted as well, with signal changes present about the right dorsal pons and cerebellum. Multiple corresponding T1 black holes. Overall appearance is highly characteristic of demyelinating disease/multiple sclerosis. Single 6 mm  focus of enhancement about a lesion involving the posterior left periventricular/periatrial white matter at the left temporal occipital region is seen, consistent with a focus of active demyelination (series 20, image 10). No other abnormal enhancement. No evidence for acute or subacute infarct. Gray-white matter differentiation maintained. No areas of chronic cortical infarction. No evidence for acute or chronic intracranial hemorrhage. No mass lesion, mass effect or midline shift. No hydrocephalus or extra-axial fluid collection. Pituitary gland suprasellar region normal. Midline structures intact. No other abnormal enhancement. Vascular: Major intracranial vascular flow voids are well maintained. Skull and upper cervical spine: Craniocervical junction within normal limits. Bone marrow signal intensity normal. No scalp soft tissue abnormality. Other: No mastoid effusion.  Inner ear structures grossly normal. MRI ORBITS FINDINGS Orbits: Globes are symmetric in size with normal appearance and morphology bilaterally. Right optic nerve is normal in appearance without intrinsic edema or enhancement. No abnormality about the right optic nerve sheath. On the left, there is subtle irregularity and probable edema involving the left optic nerve just posterior to the left globe (series 4, images 24, 23). Suggestion of associated hazy enhancement within this region (series 8, image 28). Given the patient's symptoms as well as the findings on corresponding brain MRI, findings are highly suspicious for possible optic neuritis. Remainder of the orbital soft tissues within normal limits. Extra-ocular muscles symmetric and normal. Lacrimal glands normal. Intraconal and extraconal fat well-maintained. No abnormality about the orbital apices or cavernous sinus. Optic chiasm normally situated within the suprasellar cistern. Visualized sinuses: Small mucous retention cyst present at the left frontal sinus. Scattered mucosal thickening  noted within the ethmoidal air cells and maxillary sinuses. Visualized paranasal sinuses are otherwise largely clear. Soft tissues: Unremarkable. IMPRESSION: MRI HEAD IMPRESSION: Patchy multifocal T2/FLAIR hyperintensities involving the supratentorial and infratentorial cerebral white matter, most characteristic of demyelinating disease/multiple sclerosis. Single 6 mm focus of enhancement about a lesion involving the posterior left periventricular/periatrial white matter at the left temporoccipital region, consistent with active demyelination. MRI ORBITS IMPRESSION:: Subtle irregularity, edema, and enhancement involving the left optic nerve, suspicious for acute optic neuritis. Correlation with physical exam recommended. Electronically Signed   By: Jeannine Boga M.D.   On: 11/28/2021 19:40  Medications   Scheduled Meds:  insulin aspart  0-20 Units Subcutaneous TID WC   insulin glargine-yfgn  15 Units Subcutaneous Daily   No recently discontinued medications to reconcile  LOS: 1 day   Time spent: >61min  Kristinia Leavy L Haelee Bolen, DO [facility] 11/30/2021, 7:44 AM   Available by Epic secure chat 7AM-7PM. If 7PM-7AM, please contact night-coverage Refer to [email or web address] to contact the [facility] Attending or Consulting provider for this pt

## 2021-12-01 DIAGNOSIS — G35 Multiple sclerosis: Secondary | ICD-10-CM | POA: Diagnosis not present

## 2021-12-01 DIAGNOSIS — H5121 Internuclear ophthalmoplegia, right eye: Secondary | ICD-10-CM | POA: Diagnosis not present

## 2021-12-01 DIAGNOSIS — H469 Unspecified optic neuritis: Secondary | ICD-10-CM | POA: Diagnosis not present

## 2021-12-01 LAB — GLUCOSE, CAPILLARY
Glucose-Capillary: 118 mg/dL — ABNORMAL HIGH (ref 70–99)
Glucose-Capillary: 133 mg/dL — ABNORMAL HIGH (ref 70–99)
Glucose-Capillary: 198 mg/dL — ABNORMAL HIGH (ref 70–99)
Glucose-Capillary: 138 mg/dL — ABNORMAL HIGH (ref 70–99)

## 2021-12-01 MED ORDER — INSULIN GLARGINE-YFGN 100 UNIT/ML ~~LOC~~ SOLN
20.0000 [IU] | Freq: Every day | SUBCUTANEOUS | Status: DC
  Administered 2021-12-01 – 2021-12-02 (×2): 20 [IU] via SUBCUTANEOUS
  Filled 2021-12-01 (×3): qty 0.2

## 2021-12-01 NOTE — Progress Notes (Signed)
PROGRESS NOTE  Trevor Boone    DOB: 1986-07-25, 35 y.o.  MVV:[id number]  PCP: Pcp, No   Code Status: Full Code   DOA: 11/28/2021   LOS: 2  Brief Narrative of Current Hospitalization  Trevor Boone is a 35 y.o. male with a PMH significant for obesity, tobacco use. They presented from home to the ED on 11/28/2021 with intermittent visual abnormalities x1 year. In the ED, it was found that they had COVID positive and white matter changes on head imaging. They had a brain MRI, cervical MRI and MR orbits which showed evidence of demyelinating disease. Neurology was consulted. He was started on high dose IV steroids and supportive care. Patient was admitted to medicine service for further workup and management of visual changes as outlined in detail below.  12/01/21 -stable. Can discharge 12/24 after receiving final dose of steroids.  Assessment & Plan  Principal Problem:   Dizziness Active Problems:   Internuclear ophthalmoplegia of right eye   Multiple sclerosis (HCC)  MS-  with current flare, primarily occular. Denies family history of autoimmune disease. C-spine and T-spine MRI negative. - neurology following, appreciate recs  - labs per neurology pending today as precursor for starting DMARD therapy. - continue high dose IV steroids x5 days (through 12/24) - needs OP referral to neurology and ophthalmology at dc  Tobacco use - cessation counseling - nicotine replacement PRN  Hyperglycemia- related to steroid use - semglee 20units daily + sSSI  COVID-19- diagnosed 12/12 and is asymptomatic. Over 10 days since onset. - remove isolation precautions.   DVT prophylaxis:Place and maintain sequential compression device Start: 11/28/21 1426 and ambulating frequently  Diet:  Diet Orders (From admission, onward)     Start     Ordered   11/28/21 1424  Diet regular Room service appropriate? Yes; Fluid consistency: Thin  Diet effective now       Question Answer Comment  Room  service appropriate? Yes   Fluid consistency: Thin      11/28/21 1425            Subjective 12/01/21    Pt reports no complaints today. He feels well overall.   Disposition Plan & Communication  Patient status: Inpatient  Admitted From: Home Disposition: Home Anticipated discharge date: 12/25  Family Communication: wife at bedside  Consults, Procedures, Significant Events  Consultants:  neurology  Procedures/significant events:  None  Antimicrobials:  Anti-infectives (From admission, onward)    None       Objective   Vitals:   11/30/21 2126 12/01/21 0015 12/01/21 0017 12/01/21 0631  BP: 133/76  133/72 135/88  Pulse: 84 85 79 79  Resp: 18  18 18   Temp: 98.1 F (36.7 C)  97.9 F (36.6 C) 97.6 F (36.4 C)  TempSrc: Oral  Oral Oral  SpO2: 94% 93% 92% 97%  Weight:      Height:        Intake/Output Summary (Last 24 hours) at 12/01/2021 0654 Last data filed at 11/30/2021 1914 Gross per 24 hour  Intake 198.02 ml  Output --  Net 198.02 ml    Filed Weights   11/28/21 1151  Weight: 111.1 kg    Patient BMI: Body mass index is 39.54 kg/m.   Physical Exam:  General: awake, alert, NAD HEENT: atraumatic, clear conjunctiva, anicteric sclera, MMM, hearing grossly normal Respiratory: normal respiratory effort. Cardiovascular:quick capillary refill  Nervous: A&O x3. Ambulating normally. normal speech Extremities: moves all equally, no edema, normal tone Skin: dry, intact, normal  temperature, normal color. No rashes, lesions or ulcers on exposed skin Psychiatry: normal mood  Labs   I have personally reviewed following labs and imaging studies Admission on 11/28/2021  Component Date Value Ref Range Status   Sodium 11/28/2021 140  135 - 145 mmol/L Final   Potassium 11/28/2021 3.5  3.5 - 5.1 mmol/L Final   Chloride 11/28/2021 107  98 - 111 mmol/L Final   CO2 11/28/2021 25  22 - 32 mmol/L Final   Glucose, Bld 11/28/2021 90  70 - 99 mg/dL Final   BUN  14/43/1540 9  6 - 20 mg/dL Final   Creatinine, Ser 11/28/2021 0.69  0.61 - 1.24 mg/dL Final   Calcium 08/67/6195 9.5  8.9 - 10.3 mg/dL Final   GFR, Estimated 11/28/2021 >60  >60 mL/min Final   Anion gap 11/28/2021 8  5 - 15 Final   WBC 11/28/2021 11.8 (H)  4.0 - 10.5 K/uL Final   RBC 11/28/2021 4.51  4.22 - 5.81 MIL/uL Final   Hemoglobin 11/28/2021 14.4  13.0 - 17.0 g/dL Final   HCT 09/32/6712 40.5  39.0 - 52.0 % Final   MCV 11/28/2021 89.8  80.0 - 100.0 fL Final   MCH 11/28/2021 31.9  26.0 - 34.0 pg Final   MCHC 11/28/2021 35.6  30.0 - 36.0 g/dL Final   RDW 45/80/9983 12.0  11.5 - 15.5 % Final   Platelets 11/28/2021 317  150 - 400 K/uL Final   nRBC 11/28/2021 0.0  0.0 - 0.2 % Final   Color, Urine 11/28/2021 YELLOW (A)  YELLOW Final   APPearance 11/28/2021 HAZY (A)  CLEAR Final   Specific Gravity, Urine 11/28/2021 1.015  1.005 - 1.030 Final   pH 11/28/2021 6.0  5.0 - 8.0 Final   Glucose, UA 11/28/2021 NEGATIVE  NEGATIVE mg/dL Final   Hgb urine dipstick 11/28/2021 MODERATE (A)  NEGATIVE Final   Bilirubin Urine 11/28/2021 NEGATIVE  NEGATIVE Final   Ketones, ur 11/28/2021 NEGATIVE  NEGATIVE mg/dL Final   Protein, ur 38/25/0539 NEGATIVE  NEGATIVE mg/dL Final   Nitrite 76/73/4193 NEGATIVE  NEGATIVE Final   Leukocytes,Ua 11/28/2021 NEGATIVE  NEGATIVE Final   RBC / HPF 11/28/2021 6-10  0 - 5 RBC/hpf Final   WBC, UA 11/28/2021 0-5  0 - 5 WBC/hpf Final   Bacteria, UA 11/28/2021 NONE SEEN  NONE SEEN Final   Squamous Epithelial / LPF 11/28/2021 NONE SEEN  0 - 5 Final   Mucus 11/28/2021 PRESENT   Final   SARS Coronavirus 2 by RT PCR 11/28/2021 POSITIVE (A)  NEGATIVE Final   Influenza A by PCR 11/28/2021 NEGATIVE  NEGATIVE Final   Influenza B by PCR 11/28/2021 NEGATIVE  NEGATIVE Final   HIV Screen 4th Generation wRfx 11/29/2021 Non Reactive  Non Reactive Final   Glucose-Capillary 11/28/2021 82  70 - 99 mg/dL Final   Glucose-Capillary 11/29/2021 210 (H)  70 - 99 mg/dL Final   Glucose-Capillary  11/29/2021 211 (H)  70 - 99 mg/dL Final   Glucose-Capillary 11/29/2021 198 (H)  70 - 99 mg/dL Final   Glucose-Capillary 11/29/2021 166 (H)  70 - 99 mg/dL Final   Sodium 79/01/4096 136  135 - 145 mmol/L Final   Potassium 11/30/2021 4.0  3.5 - 5.1 mmol/L Final   Chloride 11/30/2021 107  98 - 111 mmol/L Final   CO2 11/30/2021 22  22 - 32 mmol/L Final   Glucose, Bld 11/30/2021 151 (H)  70 - 99 mg/dL Final   BUN 35/32/9924 15  6 -  20 mg/dL Final   Creatinine, Ser 11/30/2021 0.76  0.61 - 1.24 mg/dL Final   Calcium 36/64/4034 9.1  8.9 - 10.3 mg/dL Final   Total Protein 74/25/9563 7.2  6.5 - 8.1 g/dL Final   Albumin 87/56/4332 3.9  3.5 - 5.0 g/dL Final   AST 95/18/8416 27  15 - 41 U/L Final   ALT 11/30/2021 58 (H)  0 - 44 U/L Final   Alkaline Phosphatase 11/30/2021 58  38 - 126 U/L Final   Total Bilirubin 11/30/2021 0.9  0.3 - 1.2 mg/dL Final   GFR, Estimated 11/30/2021 >60  >60 mL/min Final   Anion gap 11/30/2021 7  5 - 15 Final   WBC 11/30/2021 17.2 (H)  4.0 - 10.5 K/uL Final   RBC 11/30/2021 4.37  4.22 - 5.81 MIL/uL Final   Hemoglobin 11/30/2021 13.7  13.0 - 17.0 g/dL Final   HCT 60/63/0160 40.2  39.0 - 52.0 % Final   MCV 11/30/2021 92.0  80.0 - 100.0 fL Final   MCH 11/30/2021 31.4  26.0 - 34.0 pg Final   MCHC 11/30/2021 34.1  30.0 - 36.0 g/dL Final   RDW 10/93/2355 12.2  11.5 - 15.5 % Final   Platelets 11/30/2021 333  150 - 400 K/uL Final   nRBC 11/30/2021 0.0  0.0 - 0.2 % Final   Neutrophils Relative % 11/30/2021 88  % Final   Neutro Abs 11/30/2021 15.0 (H)  1.7 - 7.7 K/uL Final   Lymphocytes Relative 11/30/2021 9  % Final   Lymphs Abs 11/30/2021 1.6  0.7 - 4.0 K/uL Final   Monocytes Relative 11/30/2021 2  % Final   Monocytes Absolute 11/30/2021 0.4  0.1 - 1.0 K/uL Final   Eosinophils Relative 11/30/2021 0  % Final   Eosinophils Absolute 11/30/2021 0.0  0.0 - 0.5 K/uL Final   Basophils Relative 11/30/2021 0  % Final   Basophils Absolute 11/30/2021 0.0  0.0 - 0.1 K/uL Final    Immature Granulocytes 11/30/2021 1  % Final   Abs Immature Granulocytes 11/30/2021 0.20 (H)  0.00 - 0.07 K/uL Final   Vit D, 25-Hydroxy 11/30/2021 20.09 (L)  30 - 100 ng/mL Final   Hepatitis B Surface Ag 11/30/2021 NON REACTIVE  NON REACTIVE Final   HCV Ab 11/30/2021 NON REACTIVE  NON REACTIVE Final   Hep A IgM 11/30/2021 NON REACTIVE  NON REACTIVE Final   Hep B C IgM 11/30/2021 NON REACTIVE  NON REACTIVE Final   Glucose-Capillary 11/30/2021 124 (H)  70 - 99 mg/dL Final   Glucose-Capillary 11/30/2021 132 (H)  70 - 99 mg/dL Final   Glucose-Capillary 11/30/2021 192 (H)  70 - 99 mg/dL Final   Glucose-Capillary 11/30/2021 233 (H)  70 - 99 mg/dL Final    Imaging Studies  MR THORACIC SPINE W WO CONTRAST  Result Date: 11/30/2021 CLINICAL DATA:  Multiple sclerosis. Rule out demyelinating disease spinal cord. EXAM: MRI THORACIC WITHOUT AND WITH CONTRAST TECHNIQUE: Multiplanar and multiecho pulse sequences of the thoracic spine were obtained without and with intravenous contrast. CONTRAST:  41mL GADAVIST GADOBUTROL 1 MMOL/ML IV SOLN COMPARISON:  None. FINDINGS: Alignment:  Normal Vertebrae: Normal bone marrow.  Negative for fracture or mass. Cord: Normal spinal cord. No cord signal abnormality. Normal enhancement of the cord. Paraspinal and other soft tissues: Negative Disc levels: Normal disc spaces.  No disc protrusion or stenosis. IMPRESSION: Normal MRI thoracic spine.  Normal spinal cord. Electronically Signed   By: Marlan Palau M.D.   On: 11/30/2021 20:17  Medications   Scheduled Meds:  insulin aspart  0-20 Units Subcutaneous TID WC   insulin glargine-yfgn  15 Units Subcutaneous Daily   No recently discontinued medications to reconcile  LOS: 2 days   Time spent: >31min  Leeroy Bock, DO [facility] 12/01/2021, 6:54 AM   Available by Epic secure chat 7AM-7PM. If 7PM-7AM, please contact night-coverage Refer to [email or web address] to contact the [facility] Attending or Consulting provider  for this pt

## 2021-12-02 DIAGNOSIS — H5121 Internuclear ophthalmoplegia, right eye: Secondary | ICD-10-CM | POA: Diagnosis not present

## 2021-12-02 DIAGNOSIS — R42 Dizziness and giddiness: Secondary | ICD-10-CM | POA: Diagnosis not present

## 2021-12-02 DIAGNOSIS — G35 Multiple sclerosis: Secondary | ICD-10-CM | POA: Diagnosis not present

## 2021-12-02 LAB — GLUCOSE, CAPILLARY
Glucose-Capillary: 100 mg/dL — ABNORMAL HIGH (ref 70–99)
Glucose-Capillary: 86 mg/dL (ref 70–99)

## 2021-12-02 MED ORDER — VITAMIN D 25 MCG (1000 UNIT) PO TABS
10000.0000 [IU] | ORAL_TABLET | Freq: Every day | ORAL | Status: DC
  Administered 2021-12-02: 10:00:00 10000 [IU] via ORAL
  Filled 2021-12-02: qty 10

## 2021-12-02 MED ORDER — VITAMIN D3 25 MCG PO TABS: 10000.0000 [IU] | ORAL_TABLET | Freq: Every day | ORAL | 1 refills | Status: AC

## 2021-12-02 NOTE — Progress Notes (Signed)
Received MD order to discharge patient to home with ambulatory Neurology Referral. Reviewed discharge instructions, follow up appointments and prescriptions with patient and patient verbalized understanding.

## 2021-12-02 NOTE — TOC Transition Note (Signed)
Transition of Care [facility]) - CM/SW Discharge Note   Patient Details  Name: Trevor Boone MRN: [id number] Date of Birth: 11-Apr-1986  Transition of Care [facility]) CM/SW Contact:  Bing Quarry, RN Phone Number: 12/02/2021, 2:31 PM   Clinical Narrative:   Patient being discharged today. Spoke with wife and patient regarding following up for a PCP with [facility] for outpatient therapy referral. Spouse said they would or reach out to [facility] which follows patient.  Gabriel Cirri RN CM    Final next level of care: Home/Self Care Barriers to Discharge: Barriers Resolved   Patient Goals and CMS Choice     Choice offered to / list presented to : NA  Discharge Placement                       Discharge Plan and Services                DME Arranged: N/A DME Agency: NA       HH Arranged: NA HH Agency: NA        Social Determinants of Health (SDOH) Interventions     Readmission Risk Interventions No flowsheet data found.

## 2021-12-02 NOTE — Discharge Summary (Signed)
Physician Discharge Summary  Trevor Boone DOB: 1986-09-10 DOA: 11/28/2021  PCP: Pcp, No  Admit date: 11/28/2021 Discharge date: 12/02/2021  Admitted From: Home Disposition:  Home  Recommendations for Outpatient Follow-up:  Follow up with PCP in 1-2 weeks Follow up with Neurology, MS specialist Follow up with Opthalmology Please obtain BMP/CBC in one week Please follow up on the following pending results:None  Home Health:No Equipment/Devices:None Discharge Condition: Stable CODE STATUS: Full Diet recommendation:  Regular   Brief/Interim Summary: Trevor Boone is a 35 y.o. male with a PMH significant for obesity, tobacco use. They presented from home to the ED on 11/28/2021 with intermittent visual abnormalities x1 year. In the ED, it was found that they had COVID positive and white matter changes on head imaging. They had a brain MRI, cervical MRI and MR orbits which showed evidence of demyelinating disease, concerning for multiple sclerosis which was a new diagnosis for him.  Neurology was consulted and he was started on high-dose IV steroid which resulted in improvement of his symptoms.  Neurology is recommending starting DMARD therapy and he was given a referral to see outpatient neurology at [facility] for that purpose.  He needs to see an MS specialist.  Patient was diagnosed with COVID-19 on 11/20/2021, mostly asymptomatic and does not require any other precautions or isolation.  Patient was counseled for smoking cessation.  He was also found to have some hyperglycemia most likely secondary to steroid use which was managed with insulin while in the hospital.  Patient needs to follow-up with his primary care provider and neurologist for further recommendations.  Discharge Diagnoses:  Principal Problem:   Dizziness Active Problems:   Internuclear ophthalmoplegia of right eye   Multiple sclerosis [facility])   Discharge Instructions  Discharge Instructions      Diet - low sodium heart healthy   Complete by: As directed    Discharge instructions   Complete by: As directed    It was pleasure taking care of you. Because of your new diagnosis of multiple sclerosis/MS, you need to have a close follow-up with neurology so they can start you on medications, which will be done as an outpatient. Continue taking your vitamin D supplement as directed.  Your neurologist will adjust the dose as needed. You also need to see an eye doctor and follow their recommendations. 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   Increase activity slowly   Complete by: As directed       Allergies as of 12/02/2021   No Known Allergies      Medication List     TAKE these medications    Vitamin D3 25 MCG tablet Commonly known as: Vitamin D Take 10 tablets (10,000 Units total) by mouth daily.        No Known Allergies  Consultations: Neurology  Procedures/Studies: CT HEAD WO CONTRAST  Result Date: 11/28/2021 CLINICAL DATA:  Provided history: Dizziness, nonspecific. Additional history provided: Weakness, balance difficulty. EXAM: CT HEAD WITHOUT CONTRAST TECHNIQUE: Contiguous axial images were obtained from the base of the skull through the vertex without intravenous contrast. COMPARISON:  No pertinent prior exams available for comparison. FINDINGS: Brain: Cerebral volume is normal. Mild patchy hypoattenuation within the cerebral white matter, nonspecific. There is no acute intracranial hemorrhage. No demarcated cortical infarct. No extra-axial fluid collection. No evidence of an intracranial mass. No midline shift. Vascular: No hyperdense vessel. Skull: Normal. Negative for fracture or focal  lesion. Sinuses/Orbits: Visualized orbits show no acute finding. Small mucous retention cyst within the left frontal sinus. Trace mucosal thickening within the bilateral ethmoid sinuses. Superimposed  small-volume frothy secretions within a posterior left ethmoid air cell. IMPRESSION: No evidence of acute intracranial abnormality. Mild nonspecific cerebral white matter disease. Paranasal sinus disease at the imaged levels, as described. Electronically Signed   By: Kellie Simmering D.O.   On: 11/28/2021 12:36   MR Brain W and Wo Contrast  Result Date: 11/28/2021 CLINICAL DATA:  Initial evaluation for possible demyelinating disease. Several week history of visual disturbance, balance difficulty. EXAM: MRI HEAD AND ORBITS WITHOUT AND WITH CONTRAST TECHNIQUE: Multiplanar, multiecho pulse sequences of the brain and surrounding structures were obtained without and with intravenous contrast. Multiplanar, multiecho pulse sequences of the orbits and surrounding structures were obtained including fat saturation techniques, before and after intravenous contrast administration. CONTRAST:  73mL GADAVIST GADOBUTROL 1 MMOL/ML IV SOLN COMPARISON:  Comparison made with prior head CT from earlier the same day. FINDINGS: MRI HEAD FINDINGS Brain: Cerebral volume within normal limits. Multiple scattered patchy foci of T2/FLAIR hyperintensity are seen involving the periventricular, deep, and juxta cortical white matter both cerebral hemispheres. Several of these foci are oriented perpendicular to the lateral ventricles. Scattered callososeptal involvement noted. Patchy infratentorial involvement noted as well, with signal changes present about the right dorsal pons and cerebellum. Multiple corresponding T1 black holes. Overall appearance is highly characteristic of demyelinating disease/multiple sclerosis. Single 6 mm focus of enhancement about a lesion involving the posterior left periventricular/periatrial white matter at the left temporal occipital region is seen, consistent with a focus of active demyelination (series 20, image 10). No other abnormal enhancement. No evidence for acute or subacute infarct. Gray-white matter  differentiation maintained. No areas of chronic cortical infarction. No evidence for acute or chronic intracranial hemorrhage. No mass lesion, mass effect or midline shift. No hydrocephalus or extra-axial fluid collection. Pituitary gland suprasellar region normal. Midline structures intact. No other abnormal enhancement. Vascular: Major intracranial vascular flow voids are well maintained. Skull and upper cervical spine: Craniocervical junction within normal limits. Bone marrow signal intensity normal. No scalp soft tissue abnormality. Other: No mastoid effusion.  Inner ear structures grossly normal. MRI ORBITS FINDINGS Orbits: Globes are symmetric in size with normal appearance and morphology bilaterally. Right optic nerve is normal in appearance without intrinsic edema or enhancement. No abnormality about the right optic nerve sheath. On the left, there is subtle irregularity and probable edema involving the left optic nerve just posterior to the left globe (series 4, images 24, 23). Suggestion of associated hazy enhancement within this region (series 8, image 28). Given the patient's symptoms as well as the findings on corresponding brain MRI, findings are highly suspicious for possible optic neuritis. Remainder of the orbital soft tissues within normal limits. Extra-ocular muscles symmetric and normal. Lacrimal glands normal. Intraconal and extraconal fat well-maintained. No abnormality about the orbital apices or cavernous sinus. Optic chiasm normally situated within the suprasellar cistern. Visualized sinuses: Small mucous retention cyst present at the left frontal sinus. Scattered mucosal thickening noted within the ethmoidal air cells and maxillary sinuses. Visualized paranasal sinuses are otherwise largely clear. Soft tissues: Unremarkable. IMPRESSION: MRI HEAD IMPRESSION: Patchy multifocal T2/FLAIR hyperintensities involving the supratentorial and infratentorial cerebral white matter, most characteristic  of demyelinating disease/multiple sclerosis. Single 6 mm focus of enhancement about a lesion involving the posterior left periventricular/periatrial white matter at the left temporoccipital region, consistent with active demyelination. MRI ORBITS IMPRESSION:: Subtle irregularity,  edema, and enhancement involving the left optic nerve, suspicious for acute optic neuritis. Correlation with physical exam recommended. Electronically Signed   By: Jeannine Boga M.D.   On: 11/28/2021 19:40   MR Cervical Spine W or Wo Contrast  Result Date: 11/28/2021 CLINICAL DATA:  Multiple sclerosis. EXAM: MRI CERVICAL SPINE WITHOUT AND WITH CONTRAST TECHNIQUE: Multiplanar and multiecho pulse sequences of the cervical spine, to include the craniocervical junction and cervicothoracic junction, were obtained without and with intravenous contrast. CONTRAST:  3mL GADAVIST GADOBUTROL 1 MMOL/ML IV SOLN COMPARISON:  None. FINDINGS: Alignment: Normal Vertebrae: Negative for fracture or mass Cord: Normal signal morphology.  Normal enhancement of the cord. Posterior Fossa, vertebral arteries, paraspinal tissues: Negative Disc levels: C2-3: Negative C3-4: Negative C4-5: Disc degeneration and mild spurring. Mild spinal stenosis and mild foraminal stenosis bilaterally C5-6: Disc degeneration with diffuse uncinate spurring right greater than left. Moderate right foraminal narrowing. Mild spinal stenosis. C6-7: Disc degeneration with diffuse uncinate spurring. Mild foraminal narrowing bilaterally C7-T1: Negative IMPRESSION: Normal cervical spinal cord without evidence of demyelinating disease Cervical spondylosis as above. Electronically Signed   By: Franchot Gallo M.D.   On: 11/28/2021 19:58   MR THORACIC SPINE W WO CONTRAST  Result Date: 11/30/2021 CLINICAL DATA:  Multiple sclerosis. Rule out demyelinating disease spinal cord. EXAM: MRI THORACIC WITHOUT AND WITH CONTRAST TECHNIQUE: Multiplanar and multiecho pulse sequences of the  thoracic spine were obtained without and with intravenous contrast. CONTRAST:  28mL GADAVIST GADOBUTROL 1 MMOL/ML IV SOLN COMPARISON:  None. FINDINGS: Alignment:  Normal Vertebrae: Normal bone marrow.  Negative for fracture or mass. Cord: Normal spinal cord. No cord signal abnormality. Normal enhancement of the cord. Paraspinal and other soft tissues: Negative Disc levels: Normal disc spaces.  No disc protrusion or stenosis. IMPRESSION: Normal MRI thoracic spine.  Normal spinal cord. Electronically Signed   By: Franchot Gallo M.D.   On: 11/30/2021 20:17   MR ORBITS W WO CONTRAST  Result Date: 11/28/2021 CLINICAL DATA:  Initial evaluation for possible demyelinating disease. Several week history of visual disturbance, balance difficulty. EXAM: MRI HEAD AND ORBITS WITHOUT AND WITH CONTRAST TECHNIQUE: Multiplanar, multiecho pulse sequences of the brain and surrounding structures were obtained without and with intravenous contrast. Multiplanar, multiecho pulse sequences of the orbits and surrounding structures were obtained including fat saturation techniques, before and after intravenous contrast administration. CONTRAST:  5mL GADAVIST GADOBUTROL 1 MMOL/ML IV SOLN COMPARISON:  Comparison made with prior head CT from earlier the same day. FINDINGS: MRI HEAD FINDINGS Brain: Cerebral volume within normal limits. Multiple scattered patchy foci of T2/FLAIR hyperintensity are seen involving the periventricular, deep, and juxta cortical white matter both cerebral hemispheres. Several of these foci are oriented perpendicular to the lateral ventricles. Scattered callososeptal involvement noted. Patchy infratentorial involvement noted as well, with signal changes present about the right dorsal pons and cerebellum. Multiple corresponding T1 black holes. Overall appearance is highly characteristic of demyelinating disease/multiple sclerosis. Single 6 mm focus of enhancement about a lesion involving the posterior left  periventricular/periatrial white matter at the left temporal occipital region is seen, consistent with a focus of active demyelination (series 20, image 10). No other abnormal enhancement. No evidence for acute or subacute infarct. Gray-white matter differentiation maintained. No areas of chronic cortical infarction. No evidence for acute or chronic intracranial hemorrhage. No mass lesion, mass effect or midline shift. No hydrocephalus or extra-axial fluid collection. Pituitary gland suprasellar region normal. Midline structures intact. No other abnormal enhancement. Vascular: Major intracranial vascular flow voids  are well maintained. Skull and upper cervical spine: Craniocervical junction within normal limits. Bone marrow signal intensity normal. No scalp soft tissue abnormality. Other: No mastoid effusion.  Inner ear structures grossly normal. MRI ORBITS FINDINGS Orbits: Globes are symmetric in size with normal appearance and morphology bilaterally. Right optic nerve is normal in appearance without intrinsic edema or enhancement. No abnormality about the right optic nerve sheath. On the left, there is subtle irregularity and probable edema involving the left optic nerve just posterior to the left globe (series 4, images 24, 23). Suggestion of associated hazy enhancement within this region (series 8, image 28). Given the patient's symptoms as well as the findings on corresponding brain MRI, findings are highly suspicious for possible optic neuritis. Remainder of the orbital soft tissues within normal limits. Extra-ocular muscles symmetric and normal. Lacrimal glands normal. Intraconal and extraconal fat well-maintained. No abnormality about the orbital apices or cavernous sinus. Optic chiasm normally situated within the suprasellar cistern. Visualized sinuses: Small mucous retention cyst present at the left frontal sinus. Scattered mucosal thickening noted within the ethmoidal air cells and maxillary sinuses.  Visualized paranasal sinuses are otherwise largely clear. Soft tissues: Unremarkable. IMPRESSION: MRI HEAD IMPRESSION: Patchy multifocal T2/FLAIR hyperintensities involving the supratentorial and infratentorial cerebral white matter, most characteristic of demyelinating disease/multiple sclerosis. Single 6 mm focus of enhancement about a lesion involving the posterior left periventricular/periatrial white matter at the left temporoccipital region, consistent with active demyelination. MRI ORBITS IMPRESSION:: Subtle irregularity, edema, and enhancement involving the left optic nerve, suspicious for acute optic neuritis. Correlation with physical exam recommended. Electronically Signed   By: Jeannine Boga M.D.   On: 11/28/2021 19:40    Subjective: Patient was seen and examined today. No new complaints. Wife at bed site.  Discharge Exam: Vitals:   12/02/21 0447 12/02/21 0827  BP: (!) 142/80 131/79  Pulse: 76 80  Resp: 18 18  Temp: 97.7 F (36.5 C) 98.2 F (36.8 C)  SpO2: 98% 96%   Vitals:   12/01/21 1950 12/02/21 0059 12/02/21 0447 12/02/21 0827  BP: 135/78 128/78 (!) 142/80 131/79  Pulse: 77 72 76 80  Resp: 18 18 18 18   Temp: 98.6 F (37 C) 97.7 F (36.5 C) 97.7 F (36.5 C) 98.2 F (36.8 C)  TempSrc: Oral   Oral  SpO2: 97% 97% 98% 96%  Weight:      Height:        General: Pt is alert, awake, not in acute distress Cardiovascular: RRR, S1/S2 +, no rubs, no gallops Respiratory: CTA bilaterally, no wheezing, no rhonchi Abdominal: Soft, NT, ND, bowel sounds + Extremities: no edema, no cyanosis   The results of significant diagnostics from this hospitalization (including imaging, microbiology, ancillary and laboratory) are listed below for reference.    Microbiology: Recent Results (from the past 240 hour(s))  Resp Panel by RT-PCR (Flu A&B, Covid) Nasopharyngeal Swab     Status: Abnormal   Collection Time: 11/28/21  2:08 PM   Specimen: Nasopharyngeal Swab;  Nasopharyngeal(NP) swabs in vial transport medium  Result Value Ref Range Status   SARS Coronavirus 2 by RT PCR POSITIVE (A) NEGATIVE Final    Comment: (NOTE) SARS-CoV-2 target nucleic acids are DETECTED.  The SARS-CoV-2 RNA is generally detectable in upper respiratory specimens during the acute phase of infection. Positive results are indicative of the presence of the identified virus, but do not rule out bacterial infection or co-infection with other pathogens not detected by the test. Clinical correlation with patient history and  other diagnostic information is necessary to determine patient infection status. The expected result is Negative.  Fact Sheet for Patients: EntrepreneurPulse.com.au  Fact Sheet for Healthcare Providers: IncredibleEmployment.be  This test is not yet approved or cleared by the [place] FDA and  has been authorized for detection and/or diagnosis of SARS-CoV-2 by FDA under an Emergency Use Authorization (EUA).  This EUA will remain in effect (meaning this test can be used) for the duration of  the COVID-19 declaration under Section 564(b)(1) of the A ct, 21 U.S.C. section 360bbb-3(b)(1), unless the authorization is terminated or revoked sooner.     Influenza A by PCR NEGATIVE NEGATIVE Final   Influenza B by PCR NEGATIVE NEGATIVE Final    Comment: (NOTE) The Xpert Xpress SARS-CoV-2/FLU/RSV plus assay is intended as an aid in the diagnosis of influenza from Nasopharyngeal swab specimens and should not be used as a sole basis for treatment. Nasal washings and aspirates are unacceptable for Xpert Xpress SARS-CoV-2/FLU/RSV testing.  Fact Sheet for Patients: EntrepreneurPulse.com.au  Fact Sheet for Healthcare Providers: IncredibleEmployment.be  This test is not yet approved or cleared by the [place] FDA and has been authorized for detection and/or diagnosis of SARS-CoV-2  by FDA under an Emergency Use Authorization (EUA). This EUA will remain in effect (meaning this test can be used) for the duration of the COVID-19 declaration under Section 564(b)(1) of the Act, 21 U.S.C. section 360bbb-3(b)(1), unless the authorization is terminated or revoked.  Performed at [facility], [address] [place], [place] [address]      Labs: BNP (last 3 results) No results for input(s): BNP in the last 8760 hours. Basic Metabolic Panel: Recent Labs  Lab 11/28/21 1157 11/30/21 0559  NA 140 136  K 3.5 4.0  CL 107 107  CO2 25 22  GLUCOSE 90 151*  BUN 9 15  CREATININE 0.69 0.76  CALCIUM 9.5 9.1   Liver Function Tests: Recent Labs  Lab 11/30/21 0559  AST 27  ALT 58*  ALKPHOS 58  BILITOT 0.9  PROT 7.2  ALBUMIN 3.9   No results for input(s): LIPASE, AMYLASE in the last 168 hours. No results for input(s): AMMONIA in the last 168 hours. CBC: Recent Labs  Lab 11/28/21 1157 11/30/21 0559  WBC 11.8* 17.2*  NEUTROABS  --  15.0*  HGB 14.4 13.7  HCT 40.5 40.2  MCV 89.8 92.0  PLT 317 333   Cardiac Enzymes: No results for input(s): CKTOTAL, CKMB, CKMBINDEX, TROPONINI in the last 168 hours. BNP: Invalid input(s): POCBNP CBG: Recent Labs  Lab 12/01/21 0817 12/01/21 1228 12/01/21 1710 12/01/21 1953 12/02/21 0829  GLUCAP 118* 133* 198* 138* 100*   D-Dimer No results for input(s): DDIMER in the last 72 hours. Hgb A1c No results for input(s): HGBA1C in the last 72 hours. Lipid Profile No results for input(s): CHOL, HDL, LDLCALC, TRIG, CHOLHDL, LDLDIRECT in the last 72 hours. Thyroid function studies No results for input(s): TSH, T4TOTAL, T3FREE, THYROIDAB in the last 72 hours.  Invalid input(s): FREET3 Anemia work up No results for input(s): VITAMINB12, FOLATE, FERRITIN, TIBC, IRON, RETICCTPCT in the last 72 hours. Urinalysis    Component Value Date/Time   COLORURINE YELLOW (A) 11/28/2021 1157   APPEARANCEUR HAZY (A) 11/28/2021  1157   LABSPEC 1.015 11/28/2021 1157   PHURINE 6.0 11/28/2021 1157   GLUCOSEU NEGATIVE 11/28/2021 1157   HGBUR MODERATE (A) 11/28/2021 [place] 11/28/2021 1157   KETONESUR NEGATIVE 11/28/2021 1157   PROTEINUR NEGATIVE 11/28/2021  [place] 11/28/2021 1157   LEUKOCYTESUR NEGATIVE 11/28/2021 1157   Sepsis Labs Invalid input(s): PROCALCITONIN,  WBC,  LACTICIDVEN Microbiology Recent Results (from the past 240 hour(s))  Resp Panel by RT-PCR (Flu A&B, Covid) Nasopharyngeal Swab     Status: Abnormal   Collection Time: 11/28/21  2:08 PM   Specimen: Nasopharyngeal Swab; Nasopharyngeal(NP) swabs in vial transport medium  Result Value Ref Range Status   SARS Coronavirus 2 by RT PCR POSITIVE (A) NEGATIVE Final    Comment: (NOTE) SARS-CoV-2 target nucleic acids are DETECTED.  The SARS-CoV-2 RNA is generally detectable in upper respiratory specimens during the acute phase of infection. Positive results are indicative of the presence of the identified virus, but do not rule out bacterial infection or co-infection with other pathogens not detected by the test. Clinical correlation with patient history and other diagnostic information is necessary to determine patient infection status. The expected result is Negative.  Fact Sheet for Patients: EntrepreneurPulse.com.au  Fact Sheet for Healthcare Providers: IncredibleEmployment.be  This test is not yet approved or cleared by the [place] FDA and  has been authorized for detection and/or diagnosis of SARS-CoV-2 by FDA under an Emergency Use Authorization (EUA).  This EUA will remain in effect (meaning this test can be used) for the duration of  the COVID-19 declaration under Section 564(b)(1) of the A ct, 21 U.S.C. section 360bbb-3(b)(1), unless the authorization is terminated or revoked sooner.     Influenza A by PCR NEGATIVE NEGATIVE Final   Influenza B by PCR  NEGATIVE NEGATIVE Final    Comment: (NOTE) The Xpert Xpress SARS-CoV-2/FLU/RSV plus assay is intended as an aid in the diagnosis of influenza from Nasopharyngeal swab specimens and should not be used as a sole basis for treatment. Nasal washings and aspirates are unacceptable for Xpert Xpress SARS-CoV-2/FLU/RSV testing.  Fact Sheet for Patients: EntrepreneurPulse.com.au  Fact Sheet for Healthcare Providers: IncredibleEmployment.be  This test is not yet approved or cleared by the [place] FDA and has been authorized for detection and/or diagnosis of SARS-CoV-2 by FDA under an Emergency Use Authorization (EUA). This EUA will remain in effect (meaning this test can be used) for the duration of the COVID-19 declaration under Section 564(b)(1) of the Act, 21 U.S.C. section 360bbb-3(b)(1), unless the authorization is terminated or revoked.  Performed at [facility], [address] [place], [place] [address]     Time coordinating discharge: Over 30 minutes  SIGNED:  Lorella Nimrod, MD  [facility] 12/02/2021, 12:18 PM  If 7PM-7AM, please contact night-coverage [email or web address]  This record has been created using Systems analyst. Errors have been sought and corrected,but may not always be located. Such creation errors do not reflect on the standard of care.

## 2021-12-02 NOTE — Progress Notes (Signed)
Neurology progress note  S: Patient reports continued slight improvement in L eye blurriness and diplopia. No new neurologic complaints today. Today is day 5 of 5 solumedrol.  Imaging:  MRI brain wwo: multifocal T2/FLAIR hyperintensities involving supratentorial white matter, most periventricular and some adjacent to corpus callosum, patchy and sometimes ovioid in appearance c/f demyelinating disease. Single 72mm lesion L temporoccipital region enhances. Nonenhancing white matter lesions in L>R cerebellum.   MRI orbits wwo: irregularity, edema, and enhancement of L optic nerve c/f acute optic neuritis   MRI c spine wwo: cervical spondylosis, no e/o demyelinating lesions in cord  MRI t spine wwo: no demyelinating lesions in cord   CNS imaging personally reviewed.  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 Neck: Supple, trachea midline. Resp: CTAB, no w/r/r CV: RRR, no m/g/r; nml S1 and S2. 2+ symmetric peripheral pulses. Abd: soft/NT/ND; nabs x 4 quad Extrem: Nml bulk; no cyanosis, clubbing, or edema.   Neuro: *MS: A&O x4. Follows multi-step commands.  *Speech: fluid, nondysarthric, able to name and repeat *CN:    I: Deferred   II,III: PERRLA, L APD, VFF by confrontation, optic discs unable to be visualized 2/2 pupillary constriction   III,IV,VI: EOMI w/ Lbeating nystagmus on Lward gaze (less prominent than yesterday)   V: Sensation intact from V1 to V3 to LT   VII: Eyelid closure was full.  Smile symmetric.   VIII: Hearing intact to voice   IX,X: Voice normal, palate elevates symmetrically    XI: SCM/trap 5/5 bilat    XII: Tongue protrudes midline, no atrophy or fasciculations  *Motor:   Normal bulk.  No tremor, rigidity or bradykinesia. No pronator drift.   Strength: Dlt Bic Tri WrE WrF FgS Gr HF KnF KnE PlF DoF    Left 5 5 5 5 5 5 5 5 5 5 5 5     Right 5 5 5 5 5 5 5 5 5 5 5 5   *Sensory: Intact to light touch, pinprick, temperature vibration throughout. Symmetric. Propioception intact bilat.  No double-simultaneous extinction.  *Coordination:  L>R dysmetria on FNF *Reflexes:  2+ and symmetric throughout without clonus; toes down-going bilat *Gait: deferred  A/P: 35 yo man with hx tobacco abuse who presented to ED 11/28/21 with new onset L blurry vision and intermittent diplopia. Reports the following neurologic sx that developed over the past 12 mos: R eye blurry vision, balance impairment, poor depth perception, vertigo. MRI brain wwo shows multifocal T2/FLAIR hyperintensities involving supratentorial white matter, most periventricular and some adjacent to corpus callosum, patchy and sometimes ovioid in appearance c/f demyelinating disease. Single 46mm lesion L temporoccipital region enhances. Nonenhancing white matter lesions in L>R cerebellum. MRI orbits wwo c/f acute optic neuritis. No lesions in cervical or thoracic spinal cord. Presentation and imaging c/w new dx multiple sclerosis.  - Last dose solumedrol today @ 1200, patient may be discharged home after - Baseline labwork prior to DMARD therapy drawn, see above. Note CBC drawn during solumedrol tx and shows reactive leukocytosis. JC virus, quantiferon gold pending. 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 He says he can work in [employer] at his job in the meantime. I provided a work note for him today.    [name], MD [facility] [phone]  If 7pm- 7am, please page neurology on call as listed in AMION.

## 2021-12-02 NOTE — Progress Notes (Signed)
Neurology progress note  S: Patient reports continued slight improvement in L eye blurriness and diplopia. No new neurologic complaints today. Tolerating solumedrol well.  Imaging:  MRI brain wwo: multifocal T2/FLAIR hyperintensities involving supratentorial white matter, most periventricular and some adjacent to corpus callosum, patchy and sometimes ovioid in appearance c/f demyelinating disease. Single 86mm lesion L temporoccipital region enhances. Nonenhancing white matter lesions in L>R cerebellum.   MRI orbits wwo: irregularity, edema, and enhancement of L optic nerve c/f acute optic neuritis   MRI c spine wwo: cervical spondylosis, no e/o demyelinating lesions in cord  MRI t spine wwo: no demyelinating lesions in cord   CNS imaging personally reviewed.  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 Neck: Supple, trachea midline. Resp: CTAB, no w/r/r CV: RRR, no m/g/r; nml S1 and S2. 2+ symmetric peripheral pulses. Abd: soft/NT/ND; nabs x 4 quad Extrem: Nml bulk; no cyanosis, clubbing, or edema.   Neuro: *MS: A&O x4. Follows multi-step commands.  *Speech: fluid, nondysarthric, able to name and repeat *CN:    I: Deferred   II,III: PERRLA, L APD, VFF by confrontation, optic discs unable to be visualized 2/2 pupillary constriction   III,IV,VI: EOMI w/ Lbeating nystagmus on Lward gaze (less prominent than yesterday)   V: Sensation intact from V1 to V3 to LT   VII: Eyelid closure was full.  Smile symmetric.   VIII: Hearing intact to voice   IX,X: Voice normal, palate elevates symmetrically    XI: SCM/trap 5/5 bilat    XII: Tongue protrudes midline, no atrophy or fasciculations  *Motor:   Normal bulk.  No tremor, rigidity or bradykinesia. No pronator drift.   Strength: Dlt Bic Tri WrE WrF FgS Gr HF KnF KnE PlF DoF    Left 5 5 5 5 5 5 5 5 5 5 5 5     Right 5 5 5 5 5 5 5 5 5 5 5 5   *Sensory: Intact to light touch, pinprick, temperature vibration throughout. Symmetric. Propioception intact bilat.  No double-simultaneous extinction.  *Coordination:  L>R dysmetria on FNF *Reflexes:  2+ and symmetric throughout without clonus; toes down-going bilat *Gait: deferred  A/P: 35 yo man with hx tobacco abuse who presented to ED 11/28/21 with new onset L blurry vision and intermittent diplopia. Reports the following neurologic sx that developed over the past 36 mos: R eye blurry vision, balance impairment, poor depth perception, vertigo. MRI brain wwo shows multifocal T2/FLAIR hyperintensities involving supratentorial white matter, most periventricular and some adjacent to corpus callosum, patchy and sometimes ovioid in appearance c/f demyelinating disease. Single [name] enhances. Nonenhancing white matter lesions in L>R cerebellum. MRI orbits wwo c/f acute optic neuritis. No lesions in cervical or thoracic spinal cord. Presentation and imaging c/w new dx multiple sclerosis.  - Continue IV solumedrol 1000mg  q 24 hrs x5 days (end date 12/02/21). I adjusted dose times so that his last dose will be 12/02/21 @ 1200 after which he can be discharged in time for Christmas Eve with his family - Baseline labwork prior to DMARD therapy drawn, see above. Note CBC drawn  during solumedrol tx and shows reactive leukocytosis. JC virus, quantiferon gold pending. 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 He says he can work in [employer] at his job in the meantime. We can provide documentation to his work if needed per his request.  [name], MD [facility] [phone]  If 7pm- 7am, please page neurology on call as listed in AMION.

## 2021-12-03 LAB — QUANTIFERON-TB GOLD PLUS (RQFGPL)
QuantiFERON Mitogen Value: 0.01 IU/mL
QuantiFERON Nil Value: 0 IU/mL
QuantiFERON TB1 Ag Value: 0.26 IU/mL
QuantiFERON TB2 Ag Value: 0.11 IU/mL

## 2021-12-03 LAB — QUANTIFERON-TB GOLD PLUS: QuantiFERON-TB Gold Plus: UNDETERMINED — AB

## 2021-12-06 ENCOUNTER — Ambulatory Visit: Admit: 2021-12-06 | Discharge: 2021-12-07 | Payer: PRIVATE HEALTH INSURANCE

## 2021-12-06 LAB — JC VIRUS DNA,PCR (WHOLE BLOOD): JC Virus DNA, PCR, Blood: NEGATIVE

## 2021-12-12 ENCOUNTER — Institutional Professional Consult (permissible substitution): Admit: 2021-12-12 | Discharge: 2021-12-13 | Payer: PRIVATE HEALTH INSURANCE

## 2022-01-31 ENCOUNTER — Ambulatory Visit: Admit: 2022-01-31 | Discharge: 2022-02-01 | Payer: PRIVATE HEALTH INSURANCE

## 2022-02-01 ENCOUNTER — Ambulatory Visit
Admit: 2022-02-01 | Discharge: 2022-02-02 | Payer: PRIVATE HEALTH INSURANCE | Attending: Student in an Organized Health Care Education/Training Program | Primary: Student in an Organized Health Care Education/Training Program

## 2022-02-01 ENCOUNTER — Ambulatory Visit: Admit: 2022-02-01 | Discharge: 2022-02-02 | Payer: PRIVATE HEALTH INSURANCE

## 2022-02-01 DIAGNOSIS — G35 Multiple sclerosis: Principal | ICD-10-CM

## 2022-02-01 MED ORDER — GABAPENTIN 300 MG CAPSULE
ORAL_CAPSULE | Freq: Two times a day (BID) | ORAL | 3 refills | 30.00000 days | Status: CP
Start: 2022-02-01 — End: 2022-06-01

## 2022-02-27 ENCOUNTER — Other Ambulatory Visit: Payer: Self-pay | Admitting: Surgery

## 2022-02-27 DIAGNOSIS — R7612 Nonspecific reaction to cell mediated immunity measurement of gamma interferon antigen response without active tuberculosis: Secondary | ICD-10-CM

## 2022-02-28 ENCOUNTER — Telehealth: Payer: Self-pay | Admitting: Surgery

## 2022-02-28 ENCOUNTER — Ambulatory Visit: Admit: 2022-02-28 | Discharge: 2022-03-01 | Payer: PRIVATE HEALTH INSURANCE

## 2022-02-28 DIAGNOSIS — G35 Multiple sclerosis: Principal | ICD-10-CM

## 2022-02-28 DIAGNOSIS — R7612 Nonspecific reaction to cell mediated immunity measurement of gamma interferon antigen response without active tuberculosis: Secondary | ICD-10-CM

## 2022-02-28 NOTE — Telephone Encounter (Signed)
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  ? ?Per patient: ? 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 ?From our perspective, if he was fully treated for latent TB, and it sounds like he was, he does not need further treatment. Nor should this interfere with the selection of immunosuppressive agents for his treatment of MS. ? ?However, we of course defer to Neurology/Infectious disease if they feel that considering the patient's past exposure to TB he should undergo another round of latent TB treatment while on immunosuppressive meds, we are happy to oblige and begin treatment. ? ?I spoke with Minna Antis [facility] Neurology RN today, gave her info to contact the HD in [place] to request his medical records of tx for LTBI.  ? ?If he was inadequately treated for latent TB, then we will obtain a CXR. An order for a CXR is already in place if he needs one and can be obtained at [facility] outpatient radiology clinic any time as a walk in. Assuming the CXR is negative, we will start treatment for LTBI. All of these services are no charge to the patient if he goes through the HD. ? ?If records from the HD in [place] show that the patient had sufficient treatment in the past for LTBI, we do not recommend re-treating prior to any treatments for MS. Suggested that Neurology place an informal consult/call to a colleague in ID to see whether or not another round of treatment for LTBI is warranted in order for the patient to be treated with Ocrevus/other meds if desired. I don't see a reason for the patient to  see ID in person, defer to Neurology.  ? 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 He only needs a CXR if TB symptoms arise, and would get another CXR if we initiate another round of treatment for LTBI.  ? ?Jennye Moccasin, MD  ?

## 2022-03-02 ENCOUNTER — Telehealth: Payer: Self-pay | Admitting: Surgery

## 2022-03-02 ENCOUNTER — Ambulatory Visit
Admission: RE | Admit: 2022-03-02 | Discharge: 2022-03-02 | Disposition: A | Discharge status: Home / Self Care | Payer: BC Managed Care – PPO | Attending: Surgery | Admitting: Surgery

## 2022-03-02 ENCOUNTER — Ambulatory Visit
Admission: RE | Admit: 2022-03-02 | Discharge: 2022-03-02 | Disposition: A | Discharge status: Home / Self Care | Payer: BC Managed Care – PPO | Source: Ambulatory Visit | Attending: Surgery | Admitting: Surgery

## 2022-03-02 DIAGNOSIS — R7612 Nonspecific reaction to cell mediated immunity measurement of gamma interferon antigen response without active tuberculosis: Secondary | ICD-10-CM

## 2022-03-02 IMAGING — CR DG CHEST 1V
1 series · 1 of 1 positions shown · non-contrast
Comparison: None.

CLINICAL DATA: 35-year-old male with a history of positive TB test

EXAM:
CHEST  1 VIEW

[dg chest 1 view]
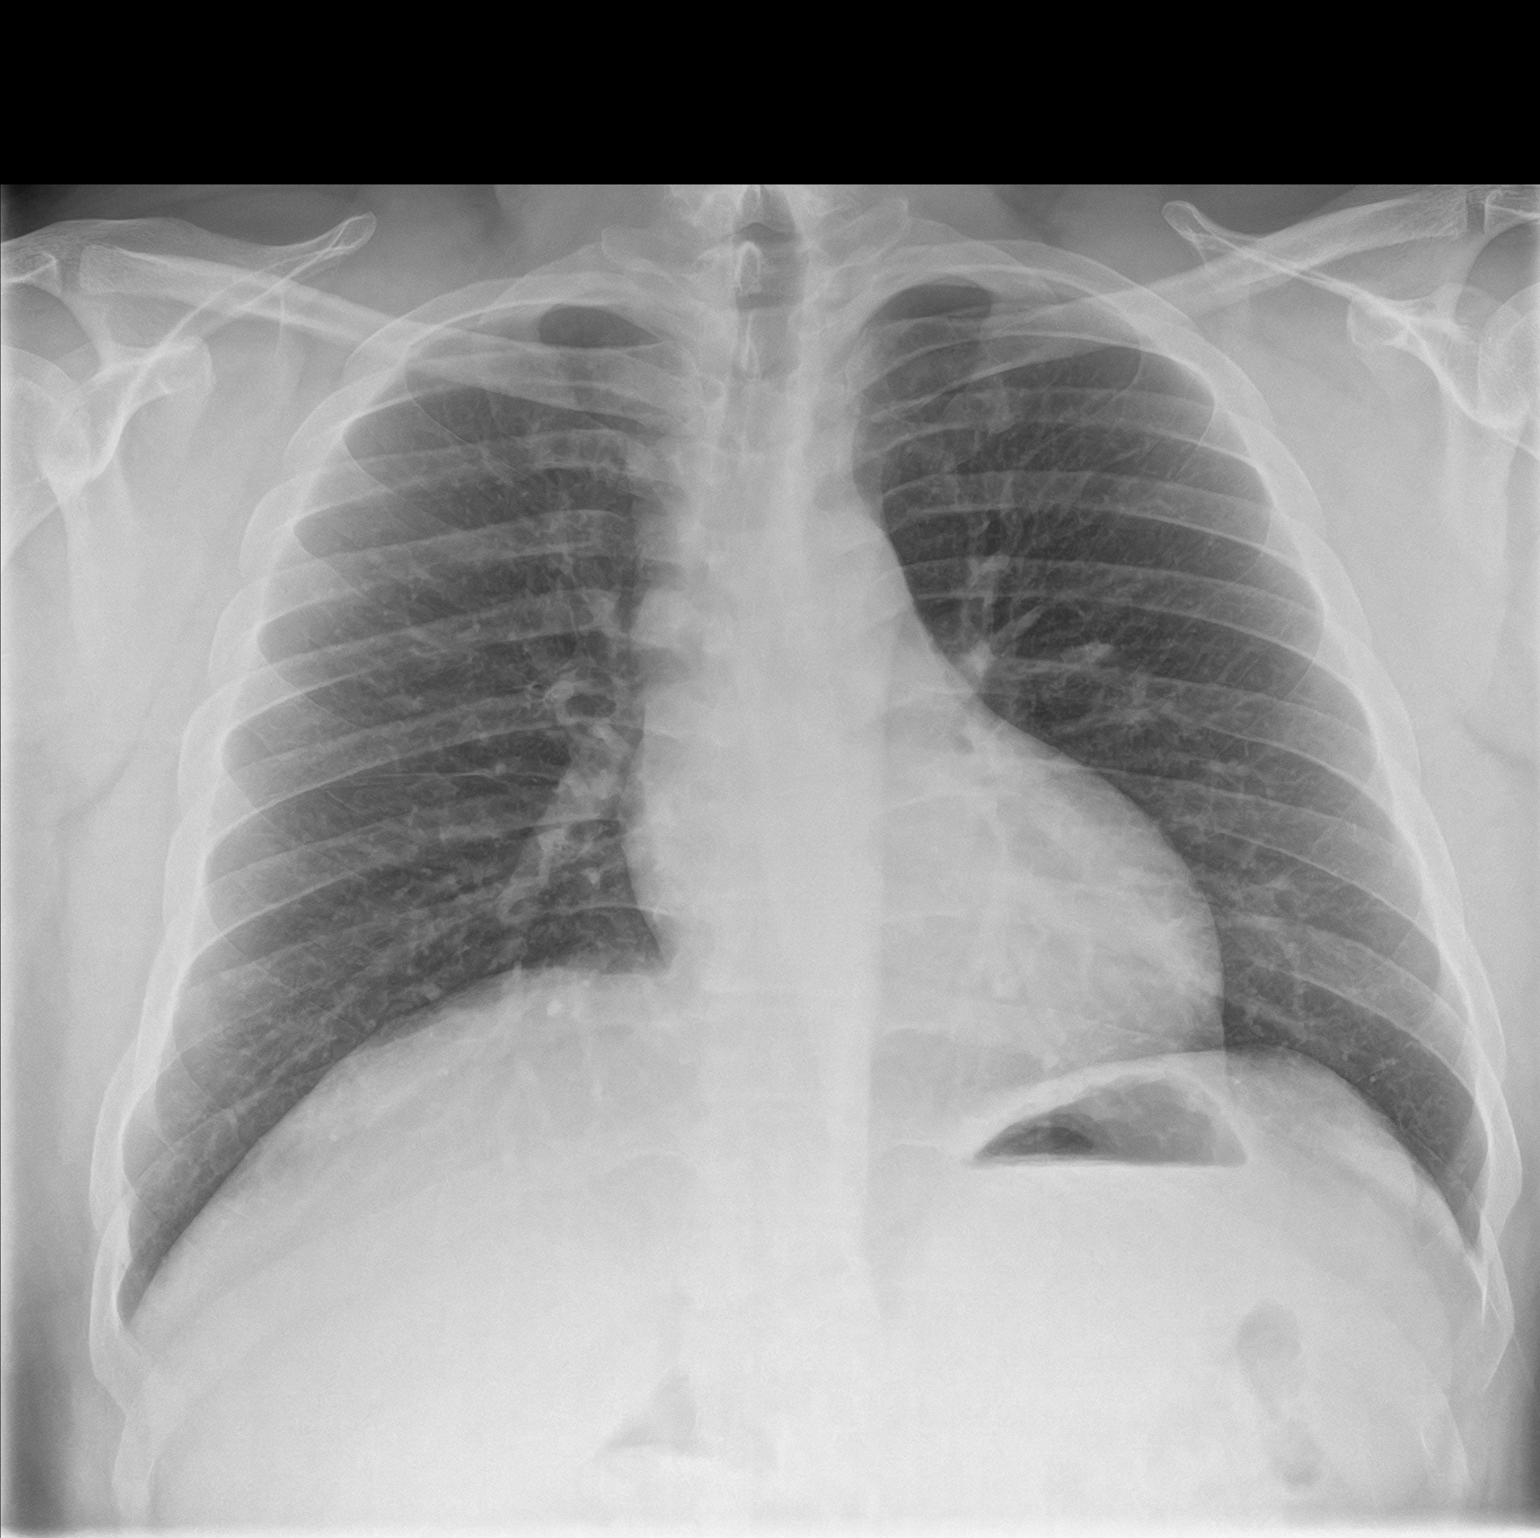

[1 of 1 positions shown; findings below may reference images not displayed]

FINDINGS: The heart size and mediastinal contours are within normal limits.
Both lungs are clear. The visualized skeletal structures are
unremarkable.
IMPRESSION: No active disease.

## 2022-03-02 NOTE — Telephone Encounter (Signed)
Nurse that works with Neurologist Collene Gobble MD, is  [facility] [phone]     [phone]  ? ?Original treatment site for LTBI in 2002 is [place] [id number]  FAX: [phone] ?

## 2022-03-02 NOTE — Telephone Encounter (Signed)
Spoke w pt, informed him that we have verbal confirmation from [place] [facility] that pt was not fully treated for latent TB when he was 36yo. Per verbal report, he had only 4 months of INH and was noncompliant.  ? ?"This patient had a positive TST 07/2001, normal cxr, only took 4 months of INH and was closed due to non-compliance." ? ?Still waiting on hard copies of records from  [place] [phone]  FAX: [phone]. ? ?Neurology  wants a CXR to confirm no pulmonary disease prior to starting immunosuppressive meds, order is in and pt states he will go today.  ? ?Patient will speak with neurologist and let us know whether he needs/wants to start treatment for LTBI next week.  ? ?Leigh Aurora, MD  ?

## 2022-03-05 ENCOUNTER — Telehealth: Payer: Self-pay | Admitting: Surgery

## 2022-03-05 DIAGNOSIS — R7612 Nonspecific reaction to cell mediated immunity measurement of gamma interferon antigen response without active tuberculosis: Secondary | ICD-10-CM

## 2022-03-05 NOTE — Telephone Encounter (Signed)
Texted patient that CXR results were normal.  ? ?Spoke with patient last week and informed him that I had a verbal report that he was not completely treated for LTBI when he was 15 in [place] at the HD. He only had 4 months of INH and was noncompliant with completing his course of treatment. ? ?Also spoke with neurology nurse Blenda Mounts last week. If he and his providers feel he needs treatment for LTBI prior to taking immunosuppressive meds for MS, he will let me know and we will setup a start treatment appt for LTBI. ? ?Patient reminded by text to let me know if he would like to to start treatment now that his CXR results are normal. ? ?Jennye Moccasin, MD . ?

## 2022-03-05 NOTE — Telephone Encounter (Signed)
Patient texted back, relieved at normal CXR results and will consult with his provider, will let me know if he wants to initiate treatment for LTBI. ? ?Jennye Moccasin, MD  ?

## 2022-03-12 ENCOUNTER — Ambulatory Visit
Admit: 2022-03-12 | Discharge: 2022-03-13 | Payer: PRIVATE HEALTH INSURANCE | Attending: Pharmacist Clinician (PhC)/ Clinical Pharmacy Specialist | Primary: Pharmacist Clinician (PhC)/ Clinical Pharmacy Specialist

## 2022-03-12 DIAGNOSIS — G35 Multiple sclerosis: Principal | ICD-10-CM

## 2022-03-12 MED ORDER — GABAPENTIN 300 MG CAPSULE
ORAL_CAPSULE | Freq: Two times a day (BID) | ORAL | 3 refills | 30 days | Status: CP
Start: 2022-03-12 — End: 2022-07-10

## 2022-03-21 ENCOUNTER — Other Ambulatory Visit: Payer: Self-pay | Admitting: Surgery

## 2022-03-21 ENCOUNTER — Ambulatory Visit (LOCAL_COMMUNITY_HEALTH_CENTER): Payer: Self-pay | Admitting: Surgery

## 2022-03-21 ENCOUNTER — Telehealth: Payer: Self-pay | Admitting: Surgery

## 2022-03-21 VITALS — Wt 246.0 lb

## 2022-03-21 DIAGNOSIS — R7612 Nonspecific reaction to cell mediated immunity measurement of gamma interferon antigen response without active tuberculosis: Secondary | ICD-10-CM

## 2022-03-21 NOTE — Telephone Encounter (Signed)
Called Minna Antis RN [facility] neurology [phone], have spoken with her before about pt, did not have callback number for Betha Loa, RN who I received a message from yesterday.  ? ?Consulted Dr. Zackery Barefoot at [facility], he concurs that LTBI tx should be completed but that this treatment should not interfere with starting MS treatments, they can be given concurrently. Per Dr Valentina Lucks "He would not need to complete that (Rifampin) before starting Ocrevus, which targets B cells; antibodies are probably not big players in TB immunity and the risk of reactivation with those types of agents is not very high." ? ?Patient to come to HD tomorrow for start appointment for 600mg  Rifampin daily for 4 months. ? ? , MD  ? ? ?

## 2022-03-21 NOTE — Progress Notes (Signed)
+  QFT: 02/01/2022 ?CXR: negative 03/02/2022 ?EPI: 03/21/2022 ? ?HIV: neg 02/01/2022 ?CBC: wnl  ?LFTs: wnl except 1 point above normal for ALT; OK ? ?Tuberculosis treatment orders  ?All patients are to be monitored per [place] and county TB policies.   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 Treat for latent TB per the following: ? ?Rifampin 600mg  daily by mouth x 4 months per Dr. Ernestina Patches.  ? ?No monthly or baseline labs currently indicated. Only needs labs if concerning symptoms arise. ? ?Leigh Aurora, MD  ?

## 2022-03-21 NOTE — Telephone Encounter (Signed)
Spoke w wife, relieved that he can start LTBI tx and will not hold up MS treatment. Patient will call back later to discuss further and schedule start appt. ? ?.mecred ?

## 2022-03-21 NOTE — Progress Notes (Signed)
The patient is a 36yo male with known latent TB, initially diagnosed when he was 36yo, lived in [place] and was in juvenile detention. He started tx for LTBI during this time but only received 4 months of INH at most and was closed to follow up due to non-compliance. ? ?The patient does not currently drink alcohol, smoke or use drugs. He only takes gabapentin 300mg  2x a day, has no allergies and was recently diagnosed with multiple sclerosis.  ? ?During workup for his MS and possible treatment options, the patient had a +QFT. His neurologist at [facility], [facility], would like him to be fully treated for latent TB prior to starting immunosuppressive medications for his MS, such as Ocrevus. ? ?Consulted Dr. Quillian Quince at [facility], he concurs that LTBI tx should be completed but that this treatment should not interfere with starting MS treatments, they can be given concurrently. Per Dr [facility] "He would not need to complete that (Rifampin) before starting Ocrevus, which targets B cells; antibodies are probably not big players in TB immunity and the risk of reactivation with those types of agents is not very high." ? ?Will contact Neurology regarding our recommendations.  ? ?Patient to start LTBI treatment with Rifampin tomorrow 03/22/2022. ? ?03/24/2022, MD    ?

## 2022-03-21 NOTE — Telephone Encounter (Signed)
Pt called back, missed call and called him; discussed LTBI tx and he will start Rifampin 600mg  daily for 4 months. Treatment should not interfere with starting Ocrevus infusions. Appt tomorrow in clininc scheduled. ?

## 2022-03-22 ENCOUNTER — Ambulatory Visit (LOCAL_COMMUNITY_HEALTH_CENTER): Payer: Self-pay

## 2022-03-22 VITALS — Wt 246.5 lb

## 2022-03-22 DIAGNOSIS — G35 Multiple sclerosis: Principal | ICD-10-CM

## 2022-03-22 DIAGNOSIS — R7612 Nonspecific reaction to cell mediated immunity measurement of gamma interferon antigen response without active tuberculosis: Secondary | ICD-10-CM

## 2022-03-22 MED ORDER — RIFAMPIN 300 MG PO CAPS: 600.0000 mg | ORAL_CAPSULE | Freq: Every day | ORAL | 0 refills | Status: AC

## 2022-03-23 NOTE — Progress Notes (Signed)
In Nurse Clinic for LTBI / TB Med Start / Rifampin #1 ?Recently diagnosed with MS. Starting meds soon. Committed to completing LTBI tx.  ? ?Latent vs Active TB info sheet given. ?Rifampin info sheet given ?LTBI consent signed ?Employer letter given ?ROI signed. Pt wants PCP letter faxed to [facility] PCP (Dr. Pernell Dupre) and to neurologist (Dr Trisha Mangle). Faxed successfully.  ?TB contact card given. ? ?Advised to contact [facility] if questions, concerns, side effects. ? ?Rifampin 300 mg #60 dispensed per order by Dr Karyl Kinnier 03/21/2022. Instructions explained to take 2 capsules daily by mouth for 4 months. Questions answered and reports understanding.  ? ?Return TB med appt given 04/19/2022 at 1 pm., reminder given. Jerel Shepherd, RN ? ?

## 2022-03-26 DIAGNOSIS — G35 Multiple sclerosis: Principal | ICD-10-CM

## 2022-04-19 ENCOUNTER — Ambulatory Visit (LOCAL_COMMUNITY_HEALTH_CENTER): Payer: Self-pay

## 2022-04-19 VITALS — Wt 249.0 lb

## 2022-04-19 DIAGNOSIS — R7612 Nonspecific reaction to cell mediated immunity measurement of gamma interferon antigen response without active tuberculosis: Secondary | ICD-10-CM

## 2022-04-19 MED ORDER — RIFAMPIN 300 MG PO CAPS: 600.0000 mg | ORAL_CAPSULE | Freq: Every day | ORAL | 0 refills | Status: AC

## 2022-04-19 NOTE — Progress Notes (Signed)
In Nurse Clinic for LTBI / TB Med Management / Rifampin #2 ? ?Pt reports he will be starting his MS treatment this month, but unsure when.  ? ?Takes Rifampin daily and denies missing any pills. Pt presents RN with Rifampin bottle and pt has 6 pills (3 days worth) remaining in bottle. Voices no problems in taking Rifampin. No changes in medical hx/condition since last month.  ? ?Rifampin 300 mg #60 (Bottle #2)  dispensed per order by Dr Ralene Bathe 03/21/2022. Instructions reviewed. Advised to contact [facility] with concerns, questions. Verbalizes understanding.  ? ?Next TB med appt 05/17/2022 at 4 pm. Has reminder. Jerel Shepherd, RN ? ?

## 2022-04-27 ENCOUNTER — Ambulatory Visit: Admit: 2022-04-27 | Discharge: 2022-04-28

## 2022-04-27 DIAGNOSIS — G35 Multiple sclerosis: Principal | ICD-10-CM

## 2022-05-01 MED ORDER — REBIF REBIDOSE 44 MCG/0.5 ML SUBCUTANEOUS PEN INJECTOR
1 refills | 0 days | Status: CP
Start: 2022-05-01 — End: ?

## 2022-05-01 MED ORDER — REBIF REBIDOSE 8.8 MCG/0.2 ML-22 MCG/0.5 ML (6) SUBCUTANEOUS PEN INJ.
0 refills | 0 days | Status: CP
Start: 2022-05-01 — End: ?

## 2022-05-02 NOTE — Unmapped (Signed)
[facility] Specialty Medication Onboarding    Specialty Medication: Rebif Rebidose  Prior Authorization: Not Required   Financial Assistance: No - copay  <$25  Final Copay/Day Supply: $0 each / 28    Insurance Restrictions: None     Notes to Pharmacist:     The triage team has completed the benefits investigation and has determined that the patient is able to fill this medication at [facility]. Please contact the patient to complete the onboarding or follow up with the prescribing physician as needed.

## 2022-05-02 NOTE — Unmapped (Unsigned)
[facility]   Patient Onboarding/Medication Counseling    Nathan Yu is a 36 y.o. male with multiple sclerosis who I am counseling today on initiation of therapy.  I am speaking to the patient.    Was a Nurse, learning disability used for this call? No    Verified patient's date of birth / HIPAA.    Specialty medication(s) to be sent: Neurology: Rebif      Non-specialty medications/supplies to be sent: sharps      Medications not needed at this time: n/a         Rebif (interferon beta-1a)    Medication & Administration     Dosage: Multiple sclerosis: Inject 44 mcg under the skin three times weekly after starter kit.    Administration: Inject under the skin of the upper arm, stomach, thigh or buttocks three times weekly. Rotate sites with each injection.  Nathan Yu has chosen every MWF as their dosing schedule  Instructions for injection (Rebif Rebidose):  Remove a Rebif autoinjector from the refrigerator and allow to stand at room temperature for at least 30 minutes.  Wash your hands thoroughly and examine the autoinjector; check for the following  Expiration date  The liquid should be clear and colorless or slightly yellow  If the liquid is cloudy, discolored or you see particles, do not use  Cracks or breakage in the syringe; do not use if present  Choose an injection site and clean with an alcohol wipe. Allow the site to air dry completely  Hold the Rebif autoinjector by the body and pull off the needle cap and dispose of it.  Place the autoinjector with the needle end flat against your skin at a 90 degree angle with your thumb above the injector button  Keep the autoinjector pressed firmly against your skin and push the injector button with your thumb; you will hear a ???click??? when the injection begins.  Hold the autoinjector in place, with the injector button depressed for at least 10 seconds  Before lifting the autoinjector from your skin, make sure the syringe plunger has moved to the bottom; this indicates that the full dose has been given  If there is any blood at the injection site, wipe gently with a cotton ball or gauze pad and apply a bandaid.  Dispose of used autoinjector in a sharps container.    Adherence/Missed dose instructions: Take a missed dose as soon as you remember. If it is less than 48 hours until your next dose, skip that dose and resume your normal schedule. Do not take 2 doses within 48 hours of each other.    Lab tests required prior to treatment initiation:  LFTs: LFTs complete and WNL.    Goals of Therapy     Reduce the frequency of flare-ups   Slow MS disease progression.    Side Effects & Monitoring Parameters     Common side effects:  Flu-like symptoms (headache, weakness, fever, shakes, aches, pains and sweating)  You may take acetaminophen or ibuprofen prior to taking Rebif to help prevent this  Dizziness, drowsiness or fatigue  Upset stomach  Stomach pain  Back/muscle/joint pain  Dry mouth    The following side effects should be reported to the provider:  Signs of an allergic reaction  Signs of depression  Signs of liver problems (yellowing of the skin/eyes, dark urine, light color stool, severe stomach pain or vomiting)  LFTs will be monitored at baseline and then at 1, 3 and 6 months after  initiation then as clinically indicated  Signs of thyroid problems (weight change, feeling nervous/excitable/restless/weak, hair thinning, depression, neck swelling, heat or cold intolerance, shakiness, sweating)  Thyroid testing should occur 3 months after initiation then every 6 months thereafter  Signs of UTI (blood in the urine, burning or pain on urination, increased urinary frequency/urgency, fever, low stomach/pelvic pain)  Chest pain  Severe dizziness/passing out  Vision changes  Signs of infection (fever, chills, sore throat)  CBC with differential at 1, 3 and 6 months after initiation, then periodically as clinically indicated.  Unexplained bleeding or bruising  Severe fatigue      Contraindications, Warnings & Precautions     Latex allergy (the prefilled syringe tip cap may contain latex)  Albumin sensitivity (some formulations may contain albumin)  Thyroid dysfunction (abnormalities may develop or pre-existing conditions may be exacerbated)  Psychiatric disorders (PHQ9 screening prior to initiation, then as clinically indicated)    Drug/Food Interactions     Medication list reviewed in Epic. The patient was instructed to inform the care team before taking any new medications or supplements. No drug interactions identified.     Storage, Handling Precautions, & Disposal     Rebif should be stored in the refrigerator. If necessary it may be stored at room temperature for up to 30 days.  Dispose of used syringes/pens in a sharps container.      Current Medications (including OTC/herbals), Comorbidities and Allergies     Current Outpatient Medications   Medication Sig Dispense Refill    B cmplx 4/vit D3/C/folic/zinc (VITAL-D RX ORAL) Take by mouth.      gabapentin (NEURONTIN) 300 MG capsule Take 1 capsule (300 mg total) by mouth Two (2) times a day. 60 capsule 3    interferon beta-1a, albumin, (REBIF REBIDOSE) 44 mcg/0.5 mL PnIj Inject 44 mcg under the skin Every Monday, Wednesday, and Friday after finishing the green and yellow pens. 6 mL 1    interferon beta-1a, albumin, (REBIF REBIDOSE) 8.71mcg/0.2mL-22 mcg/0.19mL (6) PnIj Week 1 and 2: Inject 8.8 mcg (green pen) on Mondays, wednesdays, and fridays. Week 3 and 4: inject 22 mcg (yellow pen) on Mondays, wednesdays, and fridays. 4.2 mL 0     No current facility-administered medications for this visit.       No Known Allergies    Patient Active Problem List   Diagnosis    Multiple sclerosis (CMS-HCC)       Reviewed and up to date in Epic.    Appropriateness of Therapy     Acute infections noted within Epic:  No active infections  Patient reported infection: None    Is medication and dose appropriate based on diagnosis and infection status? Yes    Prescription has been clinically reviewed: Yes      Baseline Quality of Life Assessment      How many days over the past month did your MS  keep you from your normal activities? For example, brushing your teeth or getting up in the morning. 0    Financial Information     Medication Assistance provided: None Required    Anticipated copay of $0 reviewed with patient. Verified delivery address.    Delivery Information     Scheduled delivery date: 05/15/22    Expected start date: 05/16/22    Medication will be delivered via Same Day Courier to the temporary address in [place].  This shipment will not require a signature.      Explained the services we provide at [facility] and that each month we would call to set up refills.  Stressed importance of returning phone calls so that we could ensure they receive their medications in time each month.  Informed patient that we should be setting up refills 7-10 days prior to when they will run out of medication.  A pharmacist will reach out to perform a clinical assessment periodically.  Informed patient that a welcome packet, containing information about our pharmacy and other support services, a Notice of Privacy Practices, and a drug information handout will be sent.      The patient or caregiver noted above participated in the development of this care plan and knows that they can request review of or adjustments to the care plan at any time.      Patient or caregiver verbalized understanding of the above information as well as how to contact the pharmacy at [phone] option 4 with any questions/concerns.  The pharmacy is open Monday through Friday 8:30am-4:30pm.  A pharmacist is available 24/7 via pager to answer any clinical questions they may have.    Patient Specific Needs     Does the patient have any physical, cognitive, or cultural barriers? No    Does the patient have adequate living arrangements? (i.e. the ability to store and take their medication appropriately) Yes    Did you identify any home environmental safety or security hazards? No    Patient prefers to have medications discussed with  Patient     Is the patient or caregiver able to read and understand education materials at a high school level or above? Yes    Patient's primary language is  English     Is the patient high risk? No    SOCIAL DETERMINANTS OF HEALTH     At the [facility], we have learned that life circumstances - like trouble affording food, housing, utilities, or transportation can affect the health of many of our patients.   That is why we wanted to ask: are you currently experiencing any life circumstances that are negatively impacting your health and/or quality of life? Patient declined to answer    Social Determinants of Health     Financial Resource Strain: Not on file   Internet Connectivity: Not on file   Food Insecurity: Not on file   Tobacco Use: Medium Risk    Smoking Tobacco Use: Former    Smokeless Tobacco Use: Never    Passive Exposure: Not on file   Housing/Utilities: Not on file   Alcohol Use: Not on file   Transportation Needs: Not on file   Substance Use: Not on file   Health Literacy: Not on file   Physical Activity: Not on file   Interpersonal Safety: Not on file   Stress: Not on file   Intimate Partner Violence: Not on file   Depression: At risk    PHQ-2 Score: 5   Social Connections: Not on file       Would you be willing to receive help with any of the needs that you have identified today? Not applicable       [name]  [facility] Specialty Pharmacist Intimate Partner Violence: Not on file   Depression: At risk    PHQ-2 Score: 5   Social Connections: Not on file       Would you be willing to receive help with any of the needs that you have identified today? {Yes/No/Not applicable:93005}  [name]  [facility] Specialty Pharmacist

## 2022-05-09 DIAGNOSIS — G35 Multiple sclerosis: Principal | ICD-10-CM

## 2022-05-09 MED ORDER — REBIF REBIDOSE 44 MCG/0.5 ML SUBCUTANEOUS PEN INJECTOR
SUBCUTANEOUS | 1 refills | 0 days | Status: CP
Start: 2022-05-09 — End: ?
  Filled 2022-05-15: qty 4.2, 28d supply, fill #0

## 2022-05-09 MED ORDER — REBIF REBIDOSE 8.8 MCG/0.2 ML-22 MCG/0.5 ML (6) SUBCUTANEOUS PEN INJ.
0 refills | 0 days | Status: CP
Start: 2022-05-09 — End: ?

## 2022-05-09 NOTE — Unmapped (Signed)
[facility] Special[facility] Medication Onboarding    Specialty Medication: Rebif Loading & Maintenance Dose  Prior Authorization: Not Required   Financial Assistance: No - copay  <$25  Final Copay/Day Supply: $0 / each    Insurance Restrictions: None     Notes to Pharmacist:     The triage team has completed the benefits investigation and has determined that the patient is able to fill this medication at [facility] Please contact the patient to complete the onboarding or follow up with the prescribing physician as needed.

## 2022-05-14 MED ORDER — EMPTY CONTAINER
3 refills | 0 days
Start: 2022-05-14 — End: ?

## 2022-05-15 MED FILL — EMPTY CONTAINER: 120 days supply | Qty: 1 | Fill #0

## 2022-05-17 ENCOUNTER — Ambulatory Visit (LOCAL_COMMUNITY_HEALTH_CENTER): Payer: Medicaid Other

## 2022-05-17 VITALS — Wt 246.0 lb

## 2022-05-17 DIAGNOSIS — R7612 Nonspecific reaction to cell mediated immunity measurement of gamma interferon antigen response without active tuberculosis: Secondary | ICD-10-CM

## 2022-05-17 MED ORDER — RIFAMPIN 300 MG PO CAPS: 600.0000 mg | ORAL_CAPSULE | Freq: Every day | ORAL | 0 refills | Status: DC

## 2022-05-17 MED ORDER — RIFAMPIN 300 MG PO CAPS: 600.0000 mg | ORAL_CAPSULE | Freq: Every day | ORAL | 0 refills | Status: AC

## 2022-05-17 NOTE — Progress Notes (Signed)
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 t can cause him to have some flu like symptoms.   Dr. Lolita Lenz made aware.    He also reports he is having some decreased appetite and loose stools, but is able to eat lunch and dinner well.    Takes Rifampin daily and denies missing any pills. Pt presents RN with Rifampin bottle and pt has 6 pills (5 days worth) remaining in bottle. Voices no problems in taking Rifampin. No changes in medical hx/condition since last month.    Rifampin 300 mg #60 (Bottle #3)  dispensed per order by Dr Vertell Novak 03/21/2022. Instructions reviewed. Advised to contact [facility] with concerns, questions. Verbalizes understanding.    Next TB med app 06/14/2022 at 3:30pm. Has reminder. Dr. Lolita Lenz is aware.  Apt time note currently in epic as schedule is not open.  Dr. Lolita Lenz will help put in the schedule once schedule is open.     Robson Trickey Shelda Pal, RN

## 2022-05-21 NOTE — Unmapped (Signed)
[facility] Clinical Intervention    Type of intervention: Medication administration    Medication involved: Rebif    Problem identified: Patient called in to clarify administration instructions as he was confused about every other day vs. TIW    Intervention performed: Spoke to patient and clarified that Rebif is dosed three times per week not every other day. Patient expressed understanding and repeated back instructions. Confirmed he would stick with T, Th, Sat regimen.    Follow-up needed: clinical in about 2 weeks.    Approximate time spent: 0-5 minutes    Clinical evidence used to support intervention: Drug information resource    Result of the intervention:  prevention of incorrect administration.    Nathan Yu   [facility] Specialty Pharmacist

## 2022-06-04 ENCOUNTER — Emergency Department
Admit: 2022-06-04 | Discharge: 2022-06-05 | Disposition: A | Discharge status: Home / Self Care | Payer: PRIVATE HEALTH INSURANCE

## 2022-06-04 ENCOUNTER — Ambulatory Visit
Admit: 2022-06-04 | Discharge: 2022-06-05 | Disposition: A | Discharge status: Home / Self Care | Payer: PRIVATE HEALTH INSURANCE

## 2022-06-04 ENCOUNTER — Ambulatory Visit
Admit: 2022-06-04 | Discharge: 2022-06-05 | Payer: PRIVATE HEALTH INSURANCE | Attending: Pharmacist Clinician (PhC)/ Clinical Pharmacy Specialist | Primary: Pharmacist Clinician (PhC)/ Clinical Pharmacy Specialist

## 2022-06-04 DIAGNOSIS — T887XXA Unspecified adverse effect of drug or medicament, initial encounter: Principal | ICD-10-CM

## 2022-06-04 DIAGNOSIS — G35 Multiple sclerosis: Principal | ICD-10-CM

## 2022-06-04 MED ORDER — GABAPENTIN 300 MG CAPSULE
ORAL_CAPSULE | 3 refills | 0 days | Status: CP
Start: 2022-06-04 — End: ?

## 2022-06-04 MED ADMIN — gadoterate meglumine (DOTAREM) Soln 20 mL: 20 mL | INTRAVENOUS | @ 22:00:00 | Stop: 2022-06-04

## 2022-06-04 NOTE — Unmapped (Signed)
Sent in by neurology for possible MS flare. S/s:  Feels off balance, double vision, body pain. Here for MRI and further eval.  Also reports that he has been on meds for the last 3 months for latent TB.

## 2022-06-04 NOTE — Unmapped (Signed)
[facility]  Emergency Department Provider Note    ED Clinical Impression     Final diagnoses:   Medication side effect (Primary)       Initial Impression, ED Course, Assessment and Plan     Impression: Nathan Yu is a 36 y.o. male with a history of MS presenting to the ED today for fever, worsening double vision, difficulty balancing and a fall 3 weeks ago. Patient presents on recommendation from neuro to evaluation possible MS flare.    On arrival, Geoffry Bannister has a heart rate of 87, BP of 163/108, respiratory rate of 20 with an SpO2 of 97%, and a temperature of 98.4 F.  Generally well-appearing, pleasantly conversant.  Physical exam is notable for leftward beating nystagmus but otherwise cranial nerves intact, full and equal strength in upper and lower extremities.  Full range of motion in the neck, clear lungs, normal heart tones, benign abdomen.    Concern for MS flare.  Could also be related to medication side effects.  Lower suspicion for reactivation of tuberculosis.  After evaluation in neurology clinic earlier today, plan for MRI brain.    17:00  Care signed out to the evening team with the plan to follow-up on the MRI.  Neurology is also aware of the patient.      Additional Medical Decision Making     I have reviewed the vital signs and the nursing notes. Labs and radiology results that were available during my care of the patient were independently reviewed by me and considered in my medical decision making.          I reviewed the patient's prior medical records.        Portions of this record have been created using Scientist, clinical (histocompatibility and immunogenetics). Dictation errors have been sought, but may not have been identified and corrected.  ____________________________________________       History     Chief Complaint  Medical Problem      HPI   Nathan Yu is a 36 y.o. male with a history of MS presenting to the ED for evaluation of double vision and gait abnormality. Patient reports fever, worsening double vision, difficulty balancing and a fall 3 weeks ago. No head strikes or LOC at that time. Patient presents on recommendation from his neurologist for a possible MS flare. Patient is able to take hot showers and range his neck without worsening symptoms.      Past Medical History:   Diagnosis Date   ??? Multiple sclerosis (CMS-HCC) 12/06/2021       Patient Active Problem List   Diagnosis   ??? Multiple sclerosis (CMS-HCC)       No past surgical history on file.    No current facility-administered medications for this encounter.    Current Outpatient Medications:   ???  cholecalciferol, vitamin D3 25 mcg, 1,000 units,, (CHOLECALCIFEROL-25 MCG, 1,000 UNIT,) 1,000 unit (25 mcg) tablet, Take 1 tablet (25 mcg total) by mouth daily., Disp: , Rfl:   ???  empty container Misc, Use as directed to dispose of injectable of medications, Disp: 1 each, Rfl: 3  ???  gabapentin (NEURONTIN) 300 MG capsule, Take 1 capsule (300 mg) in the morning, and 2 capsules (600 mg) in the evening, Disp: 90 capsule, Rfl: 3  ???  interferon beta-1a, albumin, (REBIF REBIDOSE) 44 mcg/0.5 mL PnIj, Inject 0.5 mL under the skin Every Monday, Wednesday, and Friday. Use after completing the green and yellow pens, Disp: 6 mL,  Rfl: 1  ???  interferon beta-1a, albumin, (REBIF REBIDOSE) 8.23mcg/0.2mL-22 mcg/0.29mL (6) PnIj, Inject 8.8 mcg (green pen) on Monday, Wednesday, and Friday on Weeks 1 and 2. Inject 22 mcg (yellow pen) on Monday, Wednesday, and Friday on Weeks 3 and 4, Disp: 4.2 mL, Rfl: 0  ???  rifAMPin (RIFADIN) 300 MG capsule, Take 2 capsules (600 mg total) by mouth., Disp: , Rfl:     Allergies  Patient has no known allergies.    Family History   Problem Relation Age of Onset   ??? Cancer Mother    ??? Hypertension Sister    ??? Aneurysm Cousin        Social History  Social History     Tobacco Use   ??? Smoking status: Former     Packs/day: 0.40     Years: 1.50     Pack years: 0.60     Types: Cigarettes     Quit date: 11/02/2021 Years since quitting: 0.5   ??? Smokeless tobacco: Never   Substance Use Topics   ??? Alcohol use: Not Currently   ??? Drug use: Not Currently     Types: Marijuana         Physical Exam     ED Triage Vitals   Enc Vitals Group      BP 06/04/22 1310 163/108      Heart Rate 06/04/22 1307 94      SpO2 Pulse --       Resp 06/04/22 1310 20      Temp --       Temp src --       SpO2 06/04/22 1307 96 %     Constitutional: Appears stated age, sitting up in the stretcher resting comfortably in no acute distress.  HEENT: Normocephalic and atraumatic.Conjunctivae clear. No congestion. Moist mucous membranes. No cervical lymphadenopathy.   Heme/Lymph/Immuno: No petechiae or bruising  CV: RRR, no murmurs. Symmetric pulses in all extremities. No JVD or peripheral edema.  Resp: Clear to auscultation bilaterally. No wheezes or rhonchii.  GI: Soft and non tender, non distended. No rebound, rigidity, or guarding.   GU: There is no CVA tenderness.   MSK: Normal range of motion in all extremities.  Neuro: Normal speech and language. Left beating nystagmus noted. Cranial nerves II through XII grossly intact with no visual acuity impairment, pupils 3 mm and reactive bilaterally, sensation intact to light touch to all three distributions of the trigeminal nerve bilaterally, face is symmetric with equal forehead elevation, no gross auditory impairment, tongue extends midline, and full and equal strength with head turn and shoulder elevation bilaterally. Cerebellar testing is normal with with normal finger-to-nose, no dysdiadochokinesia,  and a normal gate. Full and equal strength with bilateral forearm flexion, extension, grip strength, hip flexion, knee flexion, knee extension, dorsiflexion, and plantarflexion. Sensation intact to light touch in upper and lower extremities bilaterally.  Skin: Warm, dry and intact.  Psychiatric: Mood and affect are normal. Speech and behavior are normal.    EKG     None.    Radiology     MRI Brain W Wo Contrast Final Result   Multiple white matter lesions in a distribution consistent with multiple sclerosis, unchanged. No new or enhancing lesions.            Procedures     None.    Documentation assistance was provided by Leeann Must, Scribe, on June 04, 2022 at 3:39 PM for Edwyna Ready, MD.    I  reviewed the documentation as performed by the scribe and agree with its contents.                Edwyna Ready, MD  06/04/22 [phone]

## 2022-06-04 NOTE — Unmapped (Signed)
Initial Consult Note        Requesting Attending Physician:  No att. providers found  Service Requesting Consult: Emergency Medicine     Assessment and Plan          Nathan Yu is a 36 y.o.  male with PMH of relapsing remitting MS and latent TB on whom I have been asked by No att. providers found to consult for new and worsening chronic neurologic symptoms concerning for MS relapse vs TB reactivation in the setting of immunosuppression with rebif.    Patient presenting with subacute symptoms of intermittent slurring of words, forgetfulness, and balance issues over the past 3 months, markedly worse over the past two weeks. He was referred from clinic for STAT MRI due to concern for possible TB reactivation (iso h/o latent TB, immunosuppression with rebif) vs MS relapse. No objective signs of MS relapse on exam, no evidence of meningismus, and MRI brain w/wo contrast without signs of MS flare or TB reactivation. Suspect gabapentin and rifampin could be contributing to some of his symptoms given many of these symptoms (eg, forgetfulness, slurred speech, worsening balance) started soon after starting these medications.     Recommendations:  - MRI brain w/wo contrast complete without evidence of MS relapse or TB reactivation  - Wean gabapentin to 200 BID x 3 days ---> 100 mg BID x 3 days --> 100 daily, consider switching to SNRI for pain control if pain/side effects persist  - Strict return precautions for fevers, severe headaches, or any new neurologic deficits.   - Will notify patient's outpatient providers of MRI results and arrange close follow up      This patient was discussed with Dr. Marianne Sofia, who agrees with the above assessment and plan.    Gaynelle Arabian, MD  PGY-3  Department of Neurology    HPI        [facility] is a 36 y.o.  male with PMH of relapsing remitting MS and latent TB on whom I have been asked by No att. providers found to consult for new and worsening chronic neurologic symptoms concerning for MS relapse vs TB reactivation in the setting of immunosuppression with rebif.    Patient recently diagnosed with MS, established care with Dr Trisha Mangle 01/2022.     He was started on treatment for latent TB with rifampin 600 mg daily therapy x 4 months, (03/24/22- mid-August) and DMT Rebif on 05/15/22 (Tuesday, Thursday, and Saturday), s/p 3 doses of Rebif 22 mcg, was anticipated to begin Week #4 6/27.     Patient presenting with subacute symptoms of intermittent slurring of words, forgetfulness, and balance issues over the past 3 months, markedly worse over the past two weeks. He was referred from clinic for STAT MRI due to concern for possible TB reactivation (iso h/o latent TB, immunosuppression with rebif) vs MS relapse. He states that these symptoms started soon after starting gabapentin and rifampin approximately 3 months ago. He states that diplopia, blurry vision, vertigo, and balance issues are his typical MS flare symptoms (s/s he had in December 2022). He states speech issues and memory are issues are new, although per documentation by Dr. Trisha Mangle on 02/01/22, he had reported intermittent slurring of speech in the past.       He denies neck pain or any infectious signs other than subjective fevers on days of rebif injections. Denies bowel/bladder incontinence.     MS history:    Patient reports that over the past year and  a half, he's been experiencing multiple episodes of vision changes (diplopia, blurriness), inability to walk, pain in one or both arms,  paraesthesias in his feet, slurring of speech, word-finding difficulties, apraxia, and fatigue that would wax and wane. Symptoms typically last about entire day before changing to a different symptom the next day. wife feel his symptoms have become more severe and more frequent. Initially went to an orthopedic doctor at the time, who prescribed him steroids which provided temporary relief. He had COVID in December and felt symptoms have worsened since then. Symptoms eventually continued and he decided to go to an hospital in December 2022 where he received a brain MRI that was consistent with multiple sclerosis. Received IV steroids in the hospital before discharge for 5 days with a brief improvement in symptoms.     Previous workup showed:  - Characteristic demyelinating appearing lesions on MRI Brain. Meets criteria for DIS based on location of lesions on MRI. He meets DIT criteria given enhancing lesion on MRI brain and at least 2 relapses.   MRI Brain w/wo contrast (11/30/2021): reviewed personally by me; several classic appearing lesions in the WM, 2 juxtacortical, several periventricular, right dorsal pontine lesions; 1 enhancing lesion in the left periatrial area  MRI C-spine w/wo contrast (11/30/2021): reviewed personally by me; no T2 lesions seen or enhancing lesions  MRI T-spine w/wo contrast (11/30/2021): reviewed personally by me; no T2 lesions seen or enhancing lesions    Mimicker workup negative for: syphilis, NMO, MOG, ANA, ENA, ANCA, ACE< IL2, HIV, Lyme, VZV    Clinical deficits:   He has a partial INO when looking to the left, would benefit from possible prisms.      No Known Allergies     No current facility-administered medications for this encounter.     Current Outpatient Medications   Medication Sig Dispense Refill   ??? cholecalciferol, vitamin D3 25 mcg, 1,000 units,, (CHOLECALCIFEROL-25 MCG, 1,000 UNIT,) 1,000 unit (25 mcg) tablet Take 1 tablet (25 mcg total) by mouth daily.     ??? empty container Misc Use as directed to dispose of injectable of medications 1 each 3   ??? gabapentin (NEURONTIN) 300 MG capsule Take 1 capsule (300 mg) in the morning, and 2 capsules (600 mg) in the evening 90 capsule 3   ??? interferon beta-1a, albumin, (REBIF REBIDOSE) 44 mcg/0.5 mL PnIj Inject 0.5 mL under the skin Every Monday, Wednesday, and Friday. Use after completing the green and yellow pens 6 mL 1   ??? interferon beta-1a, albumin, (REBIF REBIDOSE) 8.101mcg/0.2mL-22 mcg/0.62mL (6) PnIj Inject 8.8 mcg (green pen) on Monday, Wednesday, and Friday on Weeks 1 and 2. Inject 22 mcg (yellow pen) on Monday, Wednesday, and Friday on Weeks 3 and 4 4.2 mL 0   ??? rifAMPin (RIFADIN) 300 MG capsule Take 2 capsules (600 mg total) by mouth.         Past Medical History:   Diagnosis Date   ??? Multiple sclerosis (CMS-HCC) 12/06/2021       No past surgical history on file.    Social History     Socioeconomic History   ??? Marital status: Married   ??? Number of children: 5   Occupational History   ??? Occupation: Naval architect   Tobacco Use   ??? Smoking status: Former     Packs/day: 0.40     Years: 1.50     Pack years: 0.60     Types: Cigarettes     Quit date: 11/02/2021  Years since quitting: 0.5   ??? Smokeless tobacco: Never   Substance and Sexual Activity   ??? Alcohol use: Not Currently   ??? Drug use: Not Currently     Types: Marijuana   ??? Sexual activity: Yes     Partners: Female     Social Determinants of Health     Transportation Needs: No Transportation Needs   ??? Lack of Transportation (Medical): No   ??? Lack of Transportation (Non-Medical): No         Family History   Problem Relation Age of Onset   ??? Cancer Mother    ??? Hypertension Sister    ??? Aneurysm Cousin        Code Status: No Order     Review of Systems     A 12-system review of systems was conducted and was negative except as documented above in the HPI.       Objective        Heart Rate:  [66-94] 87  Resp:  [18-20] 20  BP: (141-163)/(86-108) 163/108  MAP (mmHg):  [123] 123  SpO2:  [96 %-97 %] 97 %  BMI (Calculated):  [39.89] 39.89  No intake/output data recorded.    General:  Alert and oriented to person, place, time and situation.    Language and spontaneous speech normal, no dysarthria or aphasia. Naming/fluency/repetition intact.        Cranial Nerves:   II, III- Pupils are equal and reactive to light b/l (direct and consensual reactions). Visual Acuity: ND  No visual field defect.   III, IV, VI- extra ocular movements are intact except he can't fully bury sclera of the R eye when looking to the left. Direction-changing nystagmus (R beating when looking to the right, L beating when looking to the left), no vertical skew deviation, corrective saccades are intact.  V- sensation of the face intact b/l.   VII- face symmetrical, no facial droop, normal facial movements with smile/grimace  VIII- Hearing grossly intact.   IX and X- symmetric palate contraction, no dysarthria, no dysphagia.  XI- Full shoulder shrug bilaterally; no wasting, normal tone and strength of sternocleidomastoid muscles bilaterally.  XII- No tongue atrophy, no tongue fasciculations; tongue protrudes midline, full range of movements of the tongue.     Neck flexion normal.     Motor Exam:      Normal bulk. Fasciculations not observed.      Muscle strength:             Muscles UEs   LEs     R L   R L   Deltoids 5/5 5/5 Hip flexors  5/5 5/5   Biceps 5/5 5/5 Hip extensors 5/5 5/5   Triceps 5/5 5/5 Knee flexors 5/5 5/5   Hand grip 5/5 5/5 Knee extensors 5/5 5/5   Wrist flexors 5/5 5/5 Foot dorsal flexors 5/5 5/5   Wrist extensors 5/5 5/5 Foot plantar flexors 5/5 5/5   Finger flexors 5/5 5/5 Toes dorsal flexors 5/5 5/5   Finger extensors 5/5 5/5 Toes plantar flexors 5/5 5/5         Position test UE: no pronator drift b/l.            Reflexes R L   Biceps +2 +2   Brachioradialis  +2 +2   Triceps +2 +2   Patella +3 +3   Achilles +2 +2     Normal tone b/l. Equivocal toes (previously upgoing bilaterally on 02/01/22)  Negative Hoffman's  bilaterally    Sensory system:  ?? Superficial light touch sensation: intact throughout  ?? Vibration sense: intact throughout  ?? Pinprick test for pain sensation: intact throughout  ?? Temperature sensation: intact  No sensory level     Cerebellar/Coordination:  Rapid alternating movements, finger-to-nose and heel-to-shin  bilaterally demonstrate no abnormalities. No limb ataxia bilaterally. No gait ataxia.     Gait: Normal stride, base and  armswing.Mild difficulty with tandem walk.         Diagnostic Studies      All Labs Last 24hrs: No results found for this or any previous visit (from the past 24 hour(s)).    MRI brain w/wo contrast 6/26 personally reviewed:  There are multiple foci of T2/FLAIR hyperintensity in the cortical, juxtacortical and periventricular white matter which appear unchanged. Some of the lesions demonstrate corresponding T1 black holes consistent with axonal loss.       The right optic nerve is normal in size.  There is mild left optic nerve atrophy suggestive of prior optic neuritis.  No optic nerve enhancement. No diffuse brain volume loss. Ventricles are normal in size.       There is no evidence of intracranial hemorrhage, acute infarct, or mass.

## 2022-06-04 NOTE — Unmapped (Addendum)
Recommended to present to emergency department after leaving clinic.     If continued on Rebif, see below instructions:    -Please continue Rebif   Finish Week 4: Inject 22 mcg (yellow pen) on Tuesday, Thursday, Saturday  Starting Week 5: Inject 44 mcg (teal pen) on Tuesday, Thursday, Saturday  You may take ibuprofen 200 mg and/or Benadryl 25 mg before Rebif injections, may also take another ibuprofen 200 mg dose if you wake up with flu-like symptoms  You may take your pens out of the fridge and let it sit in room temperature for 1 hour (instead of 30 minutes) to help with injection site reactions   -We will increase gabapentin to 300 mg capsules - 1 capsule in the morning, and 2 capsules in the evening  -Follow-up with Judeth Cornfield in August for a phone call and re-check liver function and clinic follow-up end of November

## 2022-06-04 NOTE — Unmapped (Signed)
Patient sent by PCP for possible MS flare

## 2022-06-04 NOTE — Unmapped (Signed)
[facility] Neurology Pharmacotherapy Clinic Summary       [facility] [address]  [facility] [address] [place]  [address]  [place] [address]  [phone]    Date: 06/04/2022  Patient Name: Nathan Yu  MRN: [id number]  PCP: Lonell Grandchild, MD            Mr. Nathan Yu is a 36 y.o. male presenting for follow up at the [facility]  for relapsing remitting multiple sclerosis . Last seen by  Worthy Flank, CPP  on 03/12/22.       Subjective:   Subjective        Obtained history from the patient alone today.    Interval history since last visit: Patient started treatment for latent TB with rifampin 600 mg daily therapy x 4 months, started mid-April and anticipated to finish mid-August. MS DMT Rebif was started 3 weeks ago on 05/15/22 (administers Tuesday, Thursday, and Saturday), where patient has administered 3 doses of Rebif 22 mcg, and is anticipated to begin Week #4 tomorrow. Patient reports he notices flu-like symptoms that awakes him ~6h after nighttime administration at 1-2AM (fever, sweating), does not pre-medicate/treat with any therapies such as ibuprofen or Benadryl. Administers Rebif ~15-20 minutes after taking out of refrigerator, and had injection site bruising after first 2 doses of Rebif 22 mcg (symptoms did not occur for 3rd injection) despite rotating sites in back of arm and abdomen, plans to inject in thigh.     Of note, patient reports he currently has double/blurry vision and has noticed balance, slurred speech and forgetfulness has worsened over past 2 weeks. Notably, patient was playing video games while standing 2 weeks ago and suddenly collapsed, did not lose consciousness.      Medication Access  --Patient reports no issues with access to medications/cost    - Medication Reconciliation:   --Reviewed medication list with [facility] and updated as necessary  --Drug-drug and drug-food interactions checked: yes, no interactions present  --Polypharmacy (patient on 5 or more medications/supplements): no   --2 discrepancies identified   -added: VitD 1000 units   -removed: B-complex  --Forrestine Him is Therapist, music for non-specialty medications          Assessment & Plan:   Assessment     Mr. Nathan Yu is a 35 y.o. male seen in consultation for the following problems.      Diagnosis ICD-10-CM Associated Orders   1. Multiple sclerosis (CMS-HCC)  G35 -Based on patient's worsening symptoms (see Subjective above), in addition to treatment for latent TB, discussed with Dr. Lia Foyer and Dr. Trisha Mangle to go to [facility] for STAT MRIs for work-up for MS flare vs. TB reactivation, and potentially start PT/OT/speech therapy sooner if rehabilitation is needed.   If MRI shows MS progression with enhancing lesions, will treat with IV steroids and will need B-cell therapy started sooner (I.e. Ocrevus).   If MRI shows no change in MS, continue to treat with IV steroids and continue Rebif MS DMT  Treat TB if reactivation has occurred     -If Rebif is continued, advised patient to pre-medicate with ibuprofen 200 mg and Benadryl 25 mg before Rebif administration, and may take additional ibuprofen if he is awoken by symptoms.   Advised patient he may also let Rebif pen sit at room temperature 1 hour prior to administration (versus recommended ~  30 minutes)  -Increase gabapentin 300 mg - 1 capsule in the AM and 2 capsules in the evening  -Restart vitD 1000 units daily    -monitoring: CBC w/diff last evaluated 02/28/22 and hepatic function 5/19/23labs stable and WNL. Re-check lab-work CBC w/ diff and hepatic function ~08/2022 along with repeat vitD  -adherence/compliance: patient reports no missed doses in the last month  -drug interactions: assessed for drug interactions  -safety/tolerability: Patient reports injection-site reaction and flu-like symptoms side effect  -Confirmed DMT refill not needed today; no refills sent today              --Forrestine Him is using [facility] for specialty pharmacy                   - Return to clinic TBD based on hospital admission today     [facility] verbalized understanding of all counseling points. Provided Neurology Pharmacy Team contacts and advised to please call should she have any further questions.     Medications reviewed in EPIC medication station and updated today by the clinical pharmacist practitioner.     I spent a total of 35 minutes face to face with the patient delivering clinical care and providing education/counseling.     Casimer Bilis, PharmD  PGY2 Ambulatory Care Pharmacy Resident    Worthy Flank, PharmD, CPP  Clinical Pharmacist, [facility] Neurology [facility]  Phone: [phone]

## 2022-06-05 NOTE — Unmapped (Signed)
Bed: 49-C  Expected date:   Expected time:   Means of arrival:   Comments:

## 2022-06-05 NOTE — Unmapped (Signed)
Disposition: Discharge    Final diagnoses:   Medication side effect (Primary)       Assumed care of patient from the previous team.     ED Course as of 06/13/22 0725   Mon Jun 04, 2022   1710 TB meds       Vitals:    06/04/22 1713   BP: 154/89   Pulse: 74   Resp: 18   Temp: 36.9 ??C (98.4 ??F)   SpO2: 98%     Documentation assistance was provided by Osvaldo Human, Scribe, on June 04, 2022 6:02 PM ,  For Georgianne Fick, MD.    June 13, 2022 7:24 AM. Documentation assistance provided by the scribe. I was present during the time the encounter was recorded. The information recorded by the scribe was done at my direction and has been reviewed and validated by me.

## 2022-06-05 NOTE — Unmapped (Signed)
[facility] Clinical Assessment & Refill Coordination Note    Nathan Yu, [place]: Jul 24, 1986  Phone: [phone] (home)     All above HIPAA information was verified with patient.     Was a Nurse, learning disability used for this call? No    Specialty Medication(s):   Neurology: Rebif     Current Outpatient Medications   Medication Sig Dispense Refill    cholecalciferol, vitamin D3 25 mcg, 1,000 units,, (CHOLECALCIFEROL-25 MCG, 1,000 UNIT,) 1,000 unit (25 mcg) tablet Take 1 tablet (25 mcg total) by mouth daily.      empty container Misc Use as directed to dispose of injectable of medications 1 each 3    gabapentin (NEURONTIN) 300 MG capsule Take 1 capsule (300 mg) in the morning, and 2 capsules (600 mg) in the evening 90 capsule 3    interferon beta-1a, albumin, (REBIF REBIDOSE) 44 mcg/0.5 mL PnIj Inject the contents of 1 syringe ( ) under the skin every Tuesday, Thursday and Saturday. Use after completing the green and yellow pens 6 mL 1    interferon beta-1a, albumin, (REBIF REBIDOSE) 8.98mcg/0.2mL-22 mcg/0.23mL (6) PnIj Inject 8.8 mcg (green pen) on Monday, Wednesday, and Friday on Weeks 1 and 2. Inject 22 mcg (yellow pen) on Monday, Wednesday, and Friday on Weeks 3 and 4 4.2 mL 0     No current facility-administered medications for this visit.        Changes to medications: Trew reports no changes at this time.    No Known Allergies    Changes to allergies: No    SPECIALTY MEDICATION ADHERENCE     Rebif 22  mcg/0.27ml : 7 days of medicine on hand     Medication Adherence    Patient reported X missed doses in the last month: 0  Specialty Medication: Rebif  Patient is on additional specialty medications: No  Informant: patient          Specialty medication(s) dose(s) confirmed: Regimen is correct and unchanged.     Are there any concerns with adherence? No    Adherence counseling provided? Not needed    CLINICAL MANAGEMENT AND INTERVENTION      Clinical Benefit Assessment:    Do you feel the medicine is effective or helping your condition?  Unable to determine at this time    Clinical Benefit counseling provided? Not needed    Adverse Effects Assessment:    Are you experiencing any side effects? No    Are you experiencing difficulty administering your medicine? No    Quality of Life Assessment:    Quality of Life    Rheumatology  Oncology  Dermatology  Cystic Fibrosis          How many days over the past month did your MS  keep you from your normal activities? For example, brushing your teeth or getting up in the morning. Patient declined to answer    Have you discussed this with your provider? Not needed    Acute Infection Status:    Acute infections noted within Epic:  No active infections  Patient reported infection: None    Therapy Appropriateness:    Is therapy appropriate and patient progressing towards therapeutic goals? Yes, therapy is appropriate and should be continued    DISEASE/MEDICATION-SPECIFIC INFORMATION      For patients on injectable medications: Patient currently has 3 doses left.  Next injection is scheduled for 06/05/22.    PATIENT SPECIFIC NEEDS     Does the patient have  any physical, cognitive, or cultural barriers? No    Is the patient high risk? No    Does the patient require a Care Management Plan? No     SOCIAL DETERMINANTS OF HEALTH     At the [facility], we have learned that life circumstances - like trouble affording food, housing, utilities, or transportation can affect the health of many of our patients.   That is why we wanted to ask: are you currently experiencing any life circumstances that are negatively impacting your health and/or quality of life? Patient declined to answer    Social Determinants of Health     Financial Resource Strain: Not on file   Internet Connectivity: Not on file   Food Insecurity: Not on file   Tobacco Use: Medium Risk    Smoking Tobacco Use: Former    Smokeless Tobacco Use: Never    Passive Exposure: Not on file Housing/Utilities: Not on file   Alcohol Use: Not on file   Transportation Needs: No Transportation Needs    Lack of Transportation (Medical): No    Lack of Transportation (Non-Medical): No   Substance Use: Not on file   Health Literacy: Not on file   Physical Activity: Not on file   Interpersonal Safety: Not on file   Stress: Not on file   Intimate Partner Violence: Not on file   Depression: At risk    PHQ-2 Score: 5   Social Connections: Not on file       Would you be willing to receive help with any of the needs that you have identified today? Not applicable       SHIPPING     Specialty Medication(s) to be Shipped:   Neurology: Rebif    Other medication(s) to be shipped: No additional medications requested for fill at this time     Changes to insurance: No    Delivery Scheduled: Yes, Expected medication delivery date: 06/07/22.     Medication will be delivered via Same Day Courier to the confirmed prescription address in [facility].    The patient will receive a drug information handout for each medication shipped and additional FDA Medication Guides as required.  Verified that patient has previously received a Conservation officer, historic buildings and a Surveyor, mining.    The patient or caregiver noted above participated in the development of this care plan and knows that they can request review of or adjustments to the care plan at any time.      All of the patient's questions and concerns have been addressed.    [name]   [facility] Specialty Pharmacist

## 2022-06-06 NOTE — Unmapped (Signed)
I reviewed the PharmD's note and agree with the documentation. I also discussed with the PharmD that given suspicion for possible MS relapse vs pseudo-relapse, the patient was recommended to go to the ER for further work-up.       Mariane Baumgarten, MD, MS  Assistant Professor of Neurology

## 2022-06-07 ENCOUNTER — Ambulatory Visit
Admit: 2022-06-07 | Discharge: 2022-06-08 | Payer: PRIVATE HEALTH INSURANCE | Attending: Physician Assistant | Primary: Physician Assistant

## 2022-06-07 DIAGNOSIS — G35 Multiple sclerosis: Principal | ICD-10-CM

## 2022-06-07 DIAGNOSIS — Z227 Latent tuberculosis: Principal | ICD-10-CM

## 2022-06-07 LAB — CBC W/ AUTO DIFF
BASOPHILS ABSOLUTE COUNT: 0 10*9/L (ref 0.0–0.1)
BASOPHILS RELATIVE PERCENT: 0.4 %
EOSINOPHILS ABSOLUTE COUNT: 0.2 10*9/L (ref 0.0–0.5)
EOSINOPHILS RELATIVE PERCENT: 2.6 %
HEMATOCRIT: 41.4 % (ref 39.0–48.0)
HEMOGLOBIN: 14.3 g/dL (ref 12.9–16.5)
LYMPHOCYTES ABSOLUTE COUNT: 2.4 10*9/L (ref 1.1–3.6)
LYMPHOCYTES RELATIVE PERCENT: 40.4 %
MEAN CORPUSCULAR HEMOGLOBIN CONC: 34.6 g/dL (ref 32.0–36.0)
MEAN CORPUSCULAR HEMOGLOBIN: 31.2 pg (ref 25.9–32.4)
MEAN CORPUSCULAR VOLUME: 90.2 fL (ref 77.6–95.7)
MEAN PLATELET VOLUME: 8.8 fL (ref 6.8–10.7)
MONOCYTES ABSOLUTE COUNT: 0.5 10*9/L (ref 0.3–0.8)
MONOCYTES RELATIVE PERCENT: 7.7 %
NEUTROPHILS ABSOLUTE COUNT: 2.9 10*9/L (ref 1.8–7.8)
NEUTROPHILS RELATIVE PERCENT: 48.9 %
PLATELET COUNT: 262 10*9/L (ref 150–450)
RED BLOOD CELL COUNT: 4.59 10*12/L (ref 4.26–5.60)
RED CELL DISTRIBUTION WIDTH: 12.7 % (ref 12.2–15.2)
WBC ADJUSTED: 5.9 10*9/L (ref 3.6–11.2)

## 2022-06-07 LAB — HEPATIC FUNCTION PANEL
ALBUMIN: 4.3 g/dL (ref 3.4–5.0)
ALKALINE PHOSPHATASE: 90 U/L (ref 46–116)
ALT (SGPT): 27 U/L (ref 10–49)
AST (SGOT): 24 U/L (ref <=34)
BILIRUBIN DIRECT: 0.2 mg/dL (ref 0.00–0.30)
BILIRUBIN TOTAL: 0.5 mg/dL (ref 0.3–1.2)
PROTEIN TOTAL: 7.2 g/dL (ref 5.7–8.2)

## 2022-06-07 MED ORDER — AMANTADINE HCL 100 MG TABLET
ORAL_TABLET | 5 refills | 0 days | Status: CP
Start: 2022-06-07 — End: ?

## 2022-06-07 MED FILL — REBIF REBIDOSE 44 MCG/0.5 ML SUBCUTANEOUS PEN INJECTOR: SUBCUTANEOUS | 28 days supply | Qty: 6 | Fill #0

## 2022-06-07 NOTE — Unmapped (Signed)
[facility] [place] [facility] at [facility]  Multiple Sclerosis / Neuroimmunology Division  Vivianna Piccini [facility]  Physician Assistant, [place], [place]  Phone: [phone]  Fax: [phone]      Nathan Yu  1986-06-28  [id number]  [address]  [place] [address]     Direct entry by:  Annette Stable, PA-C, MPAS.  Teaching Physician: Dr. Desma Mcgregor, Dr. Quillian Quince.    DATE OF VISIT: June 07, 2022.    REASON FOR VISIT: Followup in the Neuroimmunology Clinic for evaluation for  Multiple Sclerosis.       I personally spent 78 minutes face-to-face and non-face-to-face in the care of this patient, which includes all pre, intra, and post visit time on the date of service.  All documented time was specific to the E/M visit and does not include any procedures that may have been performed.    Last evaluated by Dr. Trisha Mangle 02/01/2022.  Refer to this note for clinical details of prior history which I personally reviewed.    ASSESSMENT AND PLAN:  A 36 y.o. 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    ** Relapsing Remitting Multiple Sclerosis:  -Rebif started 05/09/2022.  -Reviewed  with the patient  06/04/2022 MRI reports and  mage of Brain,  with / without contrast,  compared to 11/28/2021 which showed no new or enhancing lesions.   -Reviewed report in care everywhere for C/Tspine MRI's dated 11/28/21, 11/30/2021 which show normal spinal cord.  -Schedule re- baseline MRI of the  Brain, Cervical , and Thoracic spine with / without contrast  Schedule for  11/2022 if he stays on Rebif, if not then will need to push this back to 6 months after start of new DMT..  -Reviewed Hepatic function 04/27/2022, wnl.  -Plan for CBC/diff and repeat LF's 08/2022, 3 months after the start of Rebif.  -Injection tip handout provided in AVS.  -Once Rifampin has been completed then consider changing to more effective DMT. Discussed briefly oral medication in addition to B cell depleting.    **Latent TB:  -Chest xray, one view ,03/02/2022 negative.  -Started Rifampin 600 mg daily 02/2022. Plan to take for 3-4 months per Dr. Dr. Johnnye Lana.  -Has appointment with [facility] Department, July 6th. End date for Rifampin = 07/2022.  -Check LFT's today in clinic today    **Fatigue:  For fatigue: Start Amantadine 100mg  ( 1 tablet) first thing in the morning. After a few days if not effective enough then take a second tablet around noontime. If ineffective in one week may increase to 200mg  ( 2 tablets)  twice a day.    -Schedule Physical Therapy appt.  -Keep Neuro-Ophthalmology appt.  -Return to clinic 3 months to discuss switching DMT. For next visit discuss the need for referral to Speech therapy for slurred speech.    DIAGNOSTIC STUDIES / REVIEW OF RECORDS:  This is my first visit with this patient.  I personally reviewed the patient's prior [facility] medical records, imaging, and lab work. Flu like symptoms but does not bother him enough to take medications.    INTERVAL HISTORY / CHIEF COMPLAINT :  Reports red marks after the last 3 injections. Only setting out Rebif about 10 minutes prior to injection and holding pen in place for 2-3 seconds after injection .    Current symptoms: (If blank =  none).  Vision/double vision: Trouble  reading, has to gets close, vision foggy. Has appointment with Dr. Darel Hong.   Speech, swallowing problems:  Weakness:  Fatigue:Feels more tired in the morning than when he went to bed which started 01/2022. Sleep study two years ago. Lost some weight. Also having fatigue during the day which interferers with ADL's >50 %.  Tingling/numbness/pain: taking Gabapentin 300 mg - 600 mg which helps feeling of stepping on glass.  Balance/coordination problems:Balance is off.  Bowel/bladder control problems:  Memory, mood:  Gait:  Falls: One fall, Not sure what happened.   Headaches: Seizures:  PHQ9 = 14, denies SI/HI. Does not want to take medication.  Other symptoms:      VITAL SIGNS  BP 142/89 (BP Site: L Arm, BP Position: Sitting, BP Cuff Size: Large)  - Pulse 74  - Resp 16  - Ht 167.6 cm (5' 6)  - Wt (!) 111.2 kg (245 lb 3.2 oz)  - BMI 39.58 kg/m??     PHYSICAL EXAMINATION:  GENERAL:  Alert and oriented to person, place, time and situation.    Recent and remote memory intact.      Neurological Examination:   Cranial Nerves:   II, III- Pupils are equal 2 mm and reactive to light b/l.  Visual Acuity:Does not have glasses, OD 20/70, OS 20/70  III, IV, VI- extra ocular movements are intact, No ptosis, no nystagmus.  V- sensation of the face intact b/l.  VIII- Hearing grossly intact.  XI- Full shoulder shrug bilaterally    Motor Exam:      Muscles UEs   LEs     R L   R L   Deltoids 5/5 5/5 Hip flexors  5-/5 5/5   Biceps 5/5 5/5 Hip extensors 5/5 5/5   Triceps 5/5 5/5 Knee flexors 5/5 5/5   Hand grip 5/5 5/5 Knee extensors 5/5 5/5      Foot dorsal flexors 5/5 5/5      Foot plantar flexors 5/5 5/5      Normal bulk and tone.  No clonus.    Reflexes R L   Brachioradialis +1 +1   Biceps +1 +1   Triceps +1 +1   Patella +1 +1   Achilles +1 +1     Negative babinski.    Sensory UEs LEs    R L R L   Light touch WNL WNL WNL WNL   Pin prick Decreased but can tell sharp from dull Decreased but can tell sharp from dull WNL WNL   Vibration, >10 seconds  WNL WNL WNL WNL   Proprioception   WNL WNL      Cerebellar/Coordination:  Rapid alternating movements, finger-to-nose and heel-to-shin  bilaterally demonstrates no abnormalities.    Romberg was negative with eyes closed.    Gait: Normal stride, base and  armswing. Able to tandem, heel, and toe gait without difficulty.     REVIEW OF SYSTEMS:  A 10-systems review was performed and, unless otherwise noted, declared negative by patient.    Appointment on 04/27/2022   Component Date Value Ref Range Status    Albumin 04/27/2022 4.0  3.4 - 5.0 g/dL Final    Total Protein 04/27/2022 6.9  5.7 - 8.2 g/dL Final    Total Bilirubin 04/27/2022 0.7  0.3 - 1.2 mg/dL Final    Bilirubin, Direct 04/27/2022 0.30  0.00 - 0.30 mg/dL Final    AST 16/09/9603 27  <=34 U/L Final    ALT 04/27/2022 27  10 - 49 U/L Final  Alkaline Phosphatase 04/27/2022 84  46 - 116 U/L Final   Appointment on 02/28/2022   Component Date Value Ref Range Status    Albumin 02/28/2022 4.2  3.4 - 5.0 g/dL Final    Total Protein 02/28/2022 7.4  5.7 - 8.2 g/dL Final    Total Bilirubin 02/28/2022 0.6  0.3 - 1.2 mg/dL Final    Bilirubin, Direct 02/28/2022 0.20  0.00 - 0.30 mg/dL Final    AST 41/32/4401 28  <=34 U/L Final    ALT 02/28/2022 50 (H)  10 - 49 U/L Final    Alkaline Phosphatase 02/28/2022 79  46 - 116 U/L Final    JC Virus AB 02/28/2022 POSITIVE   Final    JC VIRUS AB INDEX 02/28/2022 2.73   Final    JC Virus Inhibition 02/28/2022 Not Performed   Final    WBC 02/28/2022 9.6  3.6 - 11.2 10*9/L Final    RBC 02/28/2022 4.67  4.26 - 5.60 10*12/L Final    HGB 02/28/2022 14.3  12.9 - 16.5 g/dL Final    HCT 02/72/5366 42.1  39.0 - 48.0 % Final    MCV 02/28/2022 90.2  77.6 - 95.7 fL Final    MCH 02/28/2022 30.7  25.9 - 32.4 pg Final    MCHC 02/28/2022 34.0  32.0 - 36.0 g/dL Final    RDW 44/02/4741 12.7  12.2 - 15.2 % Final    MPV 02/28/2022 7.9  6.8 - 10.7 fL Final    Platelet 02/28/2022 325  150 - 450 10*9/L Final    Neutrophils % 02/28/2022 57.7  % Final    Lymphocytes % 02/28/2022 32.3  % Final    Monocytes % 02/28/2022 7.6  % Final    Eosinophils % 02/28/2022 2.0  % Final    Basophils % 02/28/2022 0.4  % Final    Absolute Neutrophils 02/28/2022 5.5  1.8 - 7.8 10*9/L Final    Absolute Lymphocytes 02/28/2022 3.1  1.1 - 3.6 10*9/L Final    Absolute Monocytes 02/28/2022 0.7  0.3 - 0.8 10*9/L Final    Absolute Eosinophils 02/28/2022 0.2  0.0 - 0.5 10*9/L Final    Absolute Basophils 02/28/2022 0.0  0.0 - 0.1 10*9/L Final   Appointment on 02/01/2022   Component Date Value Ref Range Status    RPR 02/01/2022 Nonreactive Nonreactive Final    NMO AQP4 IgG, Serum 02/01/2022 Negative  Negative Final    MOG FACS 02/01/2022 Negative  Negative Final    Antinuclear Antibodies (ANA) 02/01/2022 Negative  Negative Final    ENA Screen 02/01/2022 0.20  <0.70 ENA Units Final    ANCA IFA 02/01/2022 Negative  Negative Final    MPO-Elisa 02/01/2022 Negative  Negative Final    MPO-Quant 02/01/2022 1.2  <21.0 U/mL Final    PR3 Elisa 02/01/2022 Negative  Negative Final    PR3-Quant 02/01/2022 0.9  <21.0 U/mL Final    Angio Convert Enzyme 02/01/2022 39  16 - 85 U/L Final    SOLUBLE IL-2 02/01/2022 306  137 - 838 U/mL Final    HIV Antigen/Antibody Combo 02/01/2022 Nonreactive  Nonreactive Final    Lyme Ab (Serology) 02/01/2022 Negative  Negative Final    Total IgG 02/01/2022 1,005  646 - 2,013 mg/dL Final    IgM 59/56/3875 52  40 - 230 mg/dL Final    IgA 64/33/2951 297.2  70.0 - 400.0 mg/dL Final    Varicella IgG 02/01/2022 Positive   Final    Hep B Core Total Ab 02/01/2022  Nonreactive  Nonreactive Final    Hep B Surface Ag 02/01/2022 Nonreactive  Nonreactive Final    Hepatitis C Ab 02/01/2022 Nonreactive  Nonreactive Final    dsDNA Ab 02/01/2022 Negative  Negative Final    Vitamin D Total (25OH) 02/01/2022 41.9  20.0 - 80.0 ng/mL Final    Quantiferon TB Gold Plus Interpret* 02/01/2022 Positive (A)  Negative Final    Quantiferon TB NIL value 02/01/2022 1.25  IU/mL Final    Quantiferon Mitogen Minus Nil 02/01/2022 8.75  IU/mL Final    Quantiferon Antigen 1 minus Nil 02/01/2022 8.75  IU/mL Final    Quantiferon Antigen 2 minus NIL 02/01/2022 8.75  IU/mL Final    TB NIL VALUE 02/01/2022 1.25   Final    TB AG1 VALUE 02/01/2022 10.00   Final    TB AG2 VALUE 02/01/2022 10.00   Final    TB Mitogen 02/01/2022 10.00   Final       PROBLEM LIST:    Patient Active Problem List   Diagnosis    Multiple sclerosis (CMS-HCC)         CURRENT MEDICATIONS:    Current Outpatient Medications   Medication Sig Dispense Refill    cholecalciferol, vitamin D3 25 mcg, 1,000 units,, (CHOLECALCIFEROL-25 MCG, 1,000 UNIT,) 1,000 unit (25 mcg) tablet Take 1 tablet (25 mcg total) by mouth daily.      empty container Misc Use as directed to dispose of injectable of medications 1 each 3    gabapentin (NEURONTIN) 300 MG capsule Take 1 capsule (300 mg) in the morning, and 2 capsules (600 mg) in the evening 90 capsule 3    interferon beta-1a, albumin, (REBIF REBIDOSE) 44 mcg/0.5 mL PnIj Inject the contents of 1 syringe ( ) under the skin every Tuesday, Thursday and Saturday. Use after completing the green and yellow pens 6 mL 1    interferon beta-1a, albumin, (REBIF REBIDOSE) 8.41mcg/0.2mL-22 mcg/0.78mL (6) PnIj Inject 8.8 mcg (green pen) on Monday, Wednesday, and Friday on Weeks 1 and 2. Inject 22 mcg (yellow pen) on Monday, Wednesday, and Friday on Weeks 3 and 4 4.2 mL 0     No current facility-administered medications for this visit.       Past Surgical Hx:  No past surgical history on file.    Social Hx:    Social History     Socioeconomic History    Marital status: Married    Number of children: 5   Occupational History    Occupation: [employer] doctor   Tobacco Use    Smoking status: Former     Packs/day: 0.40     Years: 1.50     Pack years: 0.60     Types: Cigarettes     Quit date: 11/02/2021     Years since quitting: 0.5    Smokeless tobacco: Never   Substance and Sexual Activity    Alcohol use: Not Currently    Drug use: Not Currently     Types: Marijuana    Sexual activity: Yes     Partners: Female     Social Determinants of Health     Transportation Needs: No Transportation Needs    Lack of Transportation (Medical): No    Lack of Transportation (Non-Medical): No       Family Hx:    Family History   Problem Relation Age of Onset    Cancer Mother     Hypertension Sister     Aneurysm Cousin  ALLERGIES:  No Known Allergies

## 2022-06-07 NOTE — Unmapped (Signed)
Below is your visit summary:    INJECTION SITE REACTIONS    An injection site reaction is characterized as bleeding, bruseing, pain or tenderness and nodules at the location that the injection is given.    Here are some things that you can do if you are having injection site reactions:  1. Pre medicate with 400mg  of Ibuprofen. Repeat THE same dose in 4 hours.  2. Let the medication come to room temperature before giving the injection.  3. Ice the site BEFORE and immediatly AFTER the injection for 5-10 minutes.  4.  Rub the site after the injection.   5. Apply warm a compress to the area if the injection continues to bother you.    If you continue to have injection site reactions check your technique. Sometimes if the pen is angled differently then one can get injection site reactions.    **For fatigue: Start Amantadine 100mg  ( 1 tablet) first thing in the morning. After a few days if not effective enough then take a second tablet around noontime. If ineffective in one week may increase to 200mg  ( 2 tablets)  twice a day.     **Check labs in September at any [facility] outpatient lab.  **Schedule your MRI's 12/20203.  **Keep the appointment with the [facility] center.  **Follow up 6 months.after MRI's.    Navigating the Challenges of MS: MS Navigator??   National MS Society: MS Navigator at [phone]  Finding answers and making decisions relies on having the right information at the right time. MS Navigator are highly skilled, compassionate professionals available Monday through Friday, 9:00 a.m. to 7:00 p.m. ET to connect you to the information, resources and support needed to move your life forward. These supportive partners help navigate the challenges of MS unique to your situation.    Penelope Fittro Jannett Celestine PA-C, MPAS  Division of Multiple Sclerosis    **NEW ADDRESS [facility] at [facility]  [address]  [place], [place] [address]    Phone [phone]  Fax [phone]    [facility] Neurology MS Clinic is a specialty clinic and there is a need for you to have a  primary care provider  who will take care of your non-neurological health.   Please set this up if you have not already done so.      In case of:  a suspected relapse (new symptoms or worsening existing symptoms, lasting for >24h)  OR  a need for an additional appointment for other reasons  OR   if you have other questions: please contact:       [facility] Desk   Phone: [phone]      Supportive Therapy, Patriciaann Clan, MSW, LCSW.        Phone [phone]    If you have questions for our  pharmacist, please call:      Worthy Flank, PharmD, CPP  Phone: [phone]      If you need financial assistance, please call:      Financial Assistance Unit      Phone: [phone]

## 2022-06-08 DIAGNOSIS — G35 Multiple sclerosis: Principal | ICD-10-CM

## 2022-06-08 MED ORDER — DULOXETINE 30 MG CAPSULE,DELAYED RELEASE
ORAL_CAPSULE | 3 refills | 0 days | Status: CP
Start: 2022-06-08 — End: ?

## 2022-06-08 MED ORDER — GABAPENTIN 100 MG CAPSULE
ORAL_CAPSULE | 0 refills | 0 days | Status: CP
Start: 2022-06-08 — End: ?

## 2022-06-08 NOTE — Unmapped (Signed)
Addended by: Jeanice Lim on: 06/08/2022 11:54 AM     Modules accepted: Orders

## 2022-06-08 NOTE — Unmapped (Signed)
I reviewed the PA's note and agree with the documentation.       Myrel Rappleye Maria Lindee Leason, MD, MS  Assistant Professor of Neurology

## 2022-06-08 NOTE — Unmapped (Signed)
Addended by: Darlyne Russian on: 06/08/2022 11:33 AM     Modules accepted: Orders

## 2022-06-08 NOTE — Unmapped (Addendum)
Neurology Pharmacist Telephone Call: MS - Neuropathic pain    Medication: Gabapentin     Spoke with patient to clarify neuropathic pain regimen, and reports he is currently taking gabapentin 300 mg in the AM and 600 mg in the PM, as prescribed at last CPP visit 06/04/22. Prior to appointment, patient had been taking gabapentin 300 mg twice daily, though noticed inadequate relief. Though given presenting symptoms at visit, he was admitted to [facility] Emergency Department after clinic visit. Upon discharge, instructions mentioned to wean off of gabapentin due to suspect of therapy (in addition to rifampin) contributing to symptoms I.e. forgetfulness, slurred speech, worsening balance after starting regimen.     Today, given patient's sensory symptoms have not improved, will proceed with gabapentin taper as instructed upon ED discharge, and start duloxetine as follows:    Days 1-3: DECREASE gabapentin to 200 mg twice daily for 3 days, START duloxetine 30 mg daily  Days 4-6: DECREASE gabapentin to 100 mg twice daily for 3 days, continue duloxetine 30 mg daily  Days 7-9: DECREASE gabapentin to 100 mg daily for 3 days, continue duloxetine 30 mg daily  Day 10-thereafter: STOP gabapentin, INCREASE duloxetine to 60 mg daily    Advised patient to continue to monitor sensory symptoms, and any new adverse effects, with close monitoring from family as well. Informed patient he should let clinic know sooner if symptoms worsen.    Casimer Bilis, PharmD  PGY2 Ambulatory Care Pharmacy Resident  June 08, 2022 11:32 AM

## 2022-06-14 ENCOUNTER — Ambulatory Visit (LOCAL_COMMUNITY_HEALTH_CENTER): Payer: Medicaid Other | Admitting: Surgery

## 2022-06-14 VITALS — Wt 224.0 lb

## 2022-06-14 DIAGNOSIS — R7612 Nonspecific reaction to cell mediated immunity measurement of gamma interferon antigen response without active tuberculosis: Secondary | ICD-10-CM | POA: Diagnosis not present

## 2022-06-14 MED ORDER — RIFAMPIN 300 MG PO CAPS: 600.0000 mg | ORAL_CAPSULE | Freq: Every day | ORAL | 0 refills | Status: AC

## 2022-06-14 NOTE — Progress Notes (Signed)
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  Patient is doing well on current medication regimen. No n/v/f/c, eating normally, no concerning weight loss. Patient is also taking several other medications for his MS including gabapentin, duloxetine, amantidine and Rebif injections 3x week (Rebif (interferon beta-1a) meds updated. He has had some fatigue, slurred speech and leg pain which his neurologist feels is due to his gabapentin which they are currently weaning off. Patient was briefly hospitalized for sx, CBC and LFTs were completely normal from 06/07/2022, no need for labs today.   Side effects are most likely from MS meds, not Rifampin, and Rifampin tx will be important to complete to prevent active TB in setting of immunosuppression. Patient is committed to completing treatment despite his current complaints.   Patient reports taking medications daily as prescribed, patient has 4-5 more days of pills.  Dispensed #4 and final month of Rifampin for LTBI tx. Dispensed #60, 300 mg capsules.   Explained to patient that will always be positive on PPD skin test and blood test for exposure to TB. If patient is asked by employer, school, or other institution to take a TB test, patient should supply proof of treatment completion and/or obtain a TB Screening at the health department or at patient's PCP. This information was also provided in writing in the patient's completion letter which was given to the patient today along with a TB treatment completion card.   A completion letter was also sent by fax to the patient's PCP/other providers as follows:  Dr. Quillian Quince [facility] Neurology [phone]  All completion letters and completion card were sent for scanning into Epic.  Patient was advised to contact [facility]/TB control phone for any concerning symptoms or questions.    Jennye Moccasin, MD

## 2022-06-20 DIAGNOSIS — G35 Multiple sclerosis: Principal | ICD-10-CM

## 2022-06-20 DIAGNOSIS — M792 Neuralgia and neuritis, unspecified: Principal | ICD-10-CM

## 2022-06-27 NOTE — Unmapped (Signed)
[facility] - Enhanced Care Program  Reason for Call: Medication; Type: Tolerability  Referral Request: Neurology - Worthy Flank, CPP    Summary of Telephone Encounter  ??? Follow up to see how patient is doing after changes to neuropathic pain medication from gabapentin to duloxetine (see Anita's note on 06/08/22). .   ??? I called pt at phone # [phone] and left a VM request to please return my call. Contact number provided.     Follow-Up:  ??? Continue to reach out to pt to monitor how pt is doing after changes to neuropathic pain medication changes.   ??? Once we have reached pt, Worthy Flank, CPP will be notified, and referral will be closed.     Call Attempt #: 1     Vertell Limber RN, [facility]   Pharmacy Department  [facility]  [address]   [place], [place] [address]  [phone]    June 27, 2022 11:22 AM

## 2022-06-29 NOTE — Unmapped (Incomplete)
[facility] - Enhanced Care Program  Reason for Call: Medication; Type: Tolerability  Referral Request: Neurology - Worthy Flank, CPP  ??  Summary of Telephone Encounter  ??? Follow up to see how patient is doing after changes to neuropathic pain medication from gabapentin to duloxetine (see Anita's note on 06/08/22). .   ??? I called pt at phone # [phone] and left a VM request to please return my call. Contact number provided.   ??  Follow-Up:  ??? Continue to reach out to pt to monitor how pt is doing after changes to neuropathic pain medication changes.   ??? Once we have reached pt, Worthy Flank, CPP will be notified, and referral will be closed.   ??  Call Attempt #: 2

## 2022-07-02 NOTE — Unmapped (Signed)
[facility] - Enhanced Care Program  Reason for Call: Medication; Type: Adherence, Tolerability, Adverse Effects and Titration  Referral Request: Neurology - Worthy Flank, CPP    Summary of Telephone Encounter  ??? I made another attempt to reach pt at phone # [phone] to follow up to see how pt is doing after neuropathic pain medication changes:   ??? Pt was tapered off gabapentin and started on duloxetine.   ?? I left pt another VM request to please return my call. Contact number provided.   Follow-Up:  ??? Continue to reach out to pt to monitor how pt is doing after changes to neuropathic pain medication changes.   ??? Once we have made contact with pt, Worthy Flank, CPP will be notified, and the referral will be closed.??  ??? Pt is scheduled with Dr. Elvera Bicker on 09/21/22 @ 1:00 pm.    Call Attempt #: 3  Time Spent on Referral: 15 minutes  Number of Days Spent on Referral: 3     Vertell Limber RN, [facility]   Pharmacy Department  [facility]  [address]   [place], [place] [address]  [phone]    July 02, 2022 5:09 PM

## 2022-07-03 NOTE — Unmapped (Signed)
[facility] - Enhanced Care Program  Reason for Call: Medication; Type: Adherence, Tolerability, Adverse Effects and Titration  Referral Request: Neurology - Worthy Flank, CPP    Summary of Telephone Encounter  ??? I received a call back from pt regarding neuropathic pain medication changes.  ??? I confirmed that pt has titrated off gabapentin and no longer is taking it. Pt increased his duloxetine to 60 mg once daily in which he is taking in the evening.   ??? Pt also stated he started on amantadine HCL 100 mg but was not able to increase the dose. He tried taking the 2nd dose (100 mg tablet) around lunch but it made him really sluggish and sleepy. He is only taking the 100 mg tablet in the morning.   ??? Prior to titration off the gabapentin and starting on the duloxetine pt stated he was having trouble with his balance (felt wobbly, like spaghetti legs), trouble with conversation (pulling words and staying engaged in coversation), and also difficulty with vision (blurry, double vision), and also with judgement and strength (he had difficulty judging the distance of something, and the strength needed to pick up the object).   ??? Pt has found improvement in speech (he is more talkative and easier pulling words for conversation which has improved his mood and self esteem. Pt stated there is improvement but still has a long ways to go. Balance has improved a little, and strength maybe slight improvement.   ??? Pt stated he would like Worthy Flank, CPP to give him a call to discuss which higher efficacy MS medication he should use. Pt stated he had a recent MRI on 06/04/22. He said his injections for REBIF give him flulike symptoms. He gets really cold about 3-4 hrs after taking injection and he shakes. Then he breaks out in a sweat. He never runs a fever and even taking Tylenol prior to injection he still feels bad. Pt would like to discuss the best treatment option for him to consider. I will let Judeth Cornfield, CPP know he would like a call so they can discuss best treatment option for him.   ??? I also transferred pt over to [facility] to refill his Rebif as he is down to a week worth of medication.     Follow-Up:  ??? I will let Worthy Flank, CPP know pt would like for her to call and discuss best treatment option moving forward (Kesimpta or Ocrevus). Pt is unsure which medication would be most effective for him and concerned the way the injections make him feel now.   ??? Referral is complete and will be closed.   ??? Pt is aware of his apt with Yolande Jolly, PA on 09/21/22 @ 1:00 pm.   ???   Call Attempt #: 4  Time Spent on Referral: 30 minutes  Number of Days Spent on Referral: 4     Vertell Limber RN, [facility]   Pharmacy Department  [facility]  [address]   [place], [place] [address]  [phone]    July 03, 2022 10:44 AM

## 2022-07-03 NOTE — Unmapped (Signed)
[facility] Refill Coordination Note    Specialty Medication(s) to be Shipped:   Neurology: Rebif    Other medication(s) to be shipped: No additional medications requested for fill at this time     Nathan Yu, DOB: 06/18/1986  Phone: [phone] (home)       All above HIPAA information was verified with patient.     Was a Nurse, learning disability used for this call? No    Completed refill call assessment today to schedule patient's medication shipment from the [facility] [phone]).  All relevant notes have been reviewed.     Specialty medication(s) and dose(s) confirmed: Regimen is correct and unchanged.   Changes to medications: Nathan Yu reports stopping the following medications: Gabapentin 300 mg .  and  starting  Duloxetine  30mg   ,   take  2 caps  PO  daily   .  and  starting  amantadine  100mg   - take  1  tab  Po  in  the  am  and   1 tab  PO  in  the  PM    Changes to insurance: No  New side effects reported not previously addressed with a pharmacist or physician: None reported  Questions for the pharmacist: No    Confirmed patient received a Conservation officer, historic buildings and a Surveyor, mining with first shipment. The patient will receive a drug information handout for each medication shipped and additional FDA Medication Guides as required.       DISEASE/MEDICATION-SPECIFIC INFORMATION        For patients on injectable medications: Patient currently has 3 doses left.  Next injection is scheduled for 07/03/22 .    SPECIALTY MEDICATION ADHERENCE     Medication Adherence    Patient reported X missed doses in the last month: 0  Specialty Medication: REBIF REBIDOSE 44 mcg/0.5 mL Pnij  Patient is on additional specialty medications: No  Patient is on more than two specialty medications: No  Any gaps in refill history greater than 2 weeks in the last 3 months: no  Demonstrates understanding of importance of adherence: yes              Were doses missed due to medication being on hold? No    Rebif Rebidose 7mcg/0.5ml  - 5 days of medicine on hand         REFERRAL TO PHARMACIST     Referral to the pharmacist: Not needed      [facility]     Shipping address confirmed in Epic.     Delivery Scheduled: Yes, Expected medication delivery date: 07/05/22 .     Medication will be delivered via Same Day Courier to the prescription address in Epic WAM.    Nathan Yu   [facility] Specialty Technician

## 2022-07-04 NOTE — Unmapped (Signed)
Neurology Clinical Pharmacist Telephone Call    Medication: Melton Alar       Called patient to discuss DMTs listed above. Patient is unsure of which medication he wants to move forward with. Recounseled on mechanism of action, dosing and administration. Patient would like some time to consider and then will let me know decision. If I do not hear from him by 07/11/2022 will reach out to him. Discussed its best to make a decision by next week so we have some time to coordinate the medication so he can try to start as soon as his rifampin treatment is over (07/19/2022).    Worthy Flank, PharmD, CPP  Clinical Pharmacist, [facility]  Phone: [phone]

## 2022-07-05 MED FILL — REBIF REBIDOSE 44 MCG/0.5 ML SUBCUTANEOUS PEN INJECTOR: SUBCUTANEOUS | 28 days supply | Qty: 6 | Fill #1

## 2022-07-13 NOTE — Unmapped (Signed)
FAX CONFIRMATION     Orders/documents that were faxed:Ocrevus order form, Hep B + IgG labs, clinical notes, demo  Facility/location it was faxed [id number]  Phone:[phone]  Fax:[phone]  Time sent:  Fax confirmation received and document scanned into media

## 2022-07-20 NOTE — Unmapped (Signed)
Patient spouse called about receiving disability paperwork. They were transferred to nurse line concerning only about if the office has received the documents they need signed off.

## 2022-07-25 MED ORDER — AMANTADINE HCL 100 MG CAPSULE
ORAL_CAPSULE | 5 refills | 0 days | Status: CP
Start: 2022-07-25 — End: ?

## 2022-07-25 NOTE — Unmapped (Signed)
Forms from Leonie Douglas was fax to [phone] . Form was also mail out to [address] [place]  [phone].  Thanks

## 2022-07-27 NOTE — Unmapped (Signed)
FAX CONFIRMATION     Orders/documents that were faxed: Appeal Request  Facility/location it was faxed to: [facility]  Phone: [phone]  Fax: [phone]  Time sent:1450  Fax confirmation received and document shredded

## 2022-07-31 NOTE — Unmapped (Addendum)
Per pharmacy staff, notice from infusion company that patient no longer has insurance. Called patient to confirm. LVM to return call with good call back time.     Called patient. LVM to return call and sent MyChart message.Marland Kitchen

## 2022-08-02 DIAGNOSIS — G35 Multiple sclerosis: Principal | ICD-10-CM

## 2022-08-02 MED ORDER — OCREVUS 30 MG/ML INTRAVENOUS SOLUTION
1 refills | 0 days | Status: CP
Start: 2022-08-02 — End: 2022-08-02

## 2022-08-02 NOTE — Unmapped (Signed)
Addended by: Jeanice Lim on: 08/02/2022 09:51 AM     Modules accepted: Orders

## 2022-09-21 ENCOUNTER — Ambulatory Visit
Admit: 2022-09-21 | Discharge: 2022-09-22 | Payer: PRIVATE HEALTH INSURANCE | Attending: Physician Assistant | Primary: Physician Assistant

## 2022-09-21 DIAGNOSIS — G35 Multiple sclerosis: Principal | ICD-10-CM

## 2022-09-21 NOTE — Unmapped (Signed)
[facility] at [facility]  Multiple Sclerosis / Neuroimmunology Division  [name] [facility]  Physician Assistant    Phone: [phone]  Fax: [phone]      Patient Name: Nathan Yu [facility]   Date of Birth: Jun 14, 1986  Medical Record Number: [id number]  [address]  [place] [address]     Direct entry by:  Cy Blamer, PA-C.  Supervising Physician: Dr. Desma Mcgregor, Dr. Quillian Quince.    DATE OF VISIT: September 21, 2022    REASON FOR VISIT: Followup in the Neuroimmunology Clinic for evaluation of Multiple Sclerosis / Demyelinating Disease / or other Neurological problem.    I personally spent 41 minutes face-to-face and non-face-to-face in the care of this patient, which includes all pre, intra, and post visit time on the date of service.  All documented time was specific to the E/M visit and does not include any procedures that may have been performed.  Last evaluated by Dr. Trisha Mangle 02/01/2022.  Refer to this note for clinical details of prior history which I personally reviewed.    ASSESSMENT AND PLAN:  A 36 y.o. Hispanic male who has Multiple sclerosis (CMS-HCC) and History of Latent TB on their problem list.    ** Relapsing Remitting Multiple Sclerosis:  -Ocrevus induction 08/09/2022, 09/13/2022. Infusion center in [place], [place].  -Reviewed from 06/07/2022 CBC/diff and Hepatic profile = wnl, from 02/01/2022 Vitamin D 25-OH =wnl.    -Schedule re- baseline MRI of the  Brain, Cervical , and Thoracic spine with / without contrast  Schedule for 03/2023. Phone number provided.  -Reminder to stay up to date on all vaccines including Flu, updated 2023-2024 COVID vaccine and RSV and if not previously obtained the Shingles and Pneumonia vaccine. Stay current with all cancer screenings including dermatology, colonoscopy (Regular screening, beginning at age 1), and if you are a male: mammogram (ages 62 to 46 years ), pap smears per CDC guidelines for people under immunosuppression. This can be obtained or arrange through the Primary provider.     -Neuro-Ophthalmology appt. 10/18/2022.    **Latent TB:  -Chest xray, one view ,03/02/2022 negative.  -Started Rifampin 600 mg daily 02/2022.- 06/2022 or 07/2022, patient not sure.    **Fatigue:  - Amantadine 100- 200mg  ( 1 -2tablet) first thing in the morning. Afternoon dose was keeping him awake at night.    -Schedule Physical Therapy appt. Unable to due schedule is unable to.  - Speech therapy for slurred speech and cognitive decline, referral placed.    -Return to clinic in 3 months wit CPP and 6 months with myself after MRI's.      INTERVAL HISTORY / CHIEF COMPLAINT :  Reports red marks after the last 3 injections. Only setting out Rebif about 10 minutes prior to injection and holding pen in place for 2-3 seconds after injection .    Current symptoms: (If blank =  none).  Vision/double vision: Trouble reading, has to gets close, vision foggy. Has appointment with Dr. Darel Hong.   Speech, swallowing problems: slurred speech at times.  Weakness:  Fatigue:Feels more tired in the morning than when he went to bed which started 01/2022. Sleep study two years ago. Lost some weight. Also having fatigue during the day which interferers with ADL's >50 %. Tried taking Amantadine 100 mg in the morning and 200 mg at noon and it was keeping him up at night.Takes CBD gummies.  Tingling/numbness/pain: taking Gabapentin 300 mg - 600  mg which helps feeling of stepping on glass.  Balance/coordination problems:Balance is off.  Bowel/bladder control problems:  Memory, mood:  Gait:  Falls: One fall, Not sure what happened.   Headaches:   Seizures:  PHQ9 = 14, denies SI/HI. Does not want to take medication.  Other symptoms: Decreased appetite.     VITAL SIGNS  BP 146/89 (BP Site: L Arm, BP Position: Sitting, BP Cuff Size: Large)  - Pulse 77  - Ht 167.6 cm (5' 6)  - Wt (!) 109.2 kg (240 lb 11.2 oz)  - BMI 38.85 kg/m??     PHYSICAL EXAMINATION:  GENERAL:  Alert and oriented to person, place, time and situation.    Recent and remote memory intact.      Neurological Examination:   Cranial Nerves:   II, III- Pupils are equal 2 mm and reactive to light b/l.  Visual Acuity:Does not have glasses, OD 20/70, OS 20/70  III, IV, VI- extra ocular movements are intact, No ptosis, no nystagmus.  V- sensation of the face intact b/l.  VIII- Hearing grossly intact.  XI- Full shoulder shrug bilaterally    Motor Exam:      Muscles UEs   LEs     R L   R L   Deltoids 5/5 5/5 Hip flexors  5-/5 5/5   Biceps 5/5 5/5 Hip extensors 5/5 5/5   Triceps 5/5 5/5 Knee flexors 5/5 5/5   Hand grip 5/5 5/5 Knee extensors 5/5 5/5      Foot dorsal flexors 5/5 5/5      Foot plantar flexors 5/5 5/5      Normal bulk and tone.  No clonus.    Reflexes R L   Brachioradialis +1 +1   Biceps +1 +1   Triceps +1 +1   Patella +1 +1   Achilles +1 +1     Negative babinski.    Sensory UEs LEs    R L R L   Light touch WNL WNL WNL WNL   Pin prick WNL WNL WNL WNL   Vibration, >10 seconds  WNL WNL WNL WNL   Proprioception   WNL WNL      Cerebellar/Coordination:  Rapid alternating movements, finger-to-nose and heel-to-shin  bilaterally demonstrates no abnormalities.    Romberg was negative with eyes closed.    Gait: Normal stride, base and  armswing. Able to tandem, heel, and toe gait without difficulty.     MRI HISTORY;   06/04/2022 MRI reports and  mage of Brain,  with / without contrast,  compared to 11/28/2021 which showed no new or enhancing lesions.  Report in care everywhere for C/Tspine MRI's dated 11/28/21, 11/30/2021 which show normal spinal cord.    REVIEW OF SYSTEMS:  A 10-systems review was performed and, unless otherwise noted, declared negative by patient.    Office Visit on 06/07/2022   Component Date Value Ref Range Status    Albumin 06/07/2022 4.3  3.4 - 5.0 g/dL Final    Total Protein 06/07/2022 7.2  5.7 - 8.2 g/dL Final    Total Bilirubin 06/07/2022 0.5  0.3 - 1.2 mg/dL Final    Bilirubin, Direct 06/07/2022 0.20  0.00 - 0.30 mg/dL Final    AST 16/09/9603 24  <=34 U/L Final    ALT 06/07/2022 27  10 - 49 U/L Final    Alkaline Phosphatase 06/07/2022 90  46 - 116 U/L Final    WBC 06/07/2022 5.9  3.6 - 11.2 10*9/L Final  RBC 06/07/2022 4.59  4.26 - 5.60 10*12/L Final    HGB 06/07/2022 14.3  12.9 - 16.5 g/dL Final    HCT 16/09/9603 41.4  39.0 - 48.0 % Final    MCV 06/07/2022 90.2  77.6 - 95.7 fL Final    MCH 06/07/2022 31.2  25.9 - 32.4 pg Final    MCHC 06/07/2022 34.6  32.0 - 36.0 g/dL Final    RDW 54/08/8118 12.7  12.2 - 15.2 % Final    MPV 06/07/2022 8.8  6.8 - 10.7 fL Final    Platelet 06/07/2022 262  150 - 450 10*9/L Final    Neutrophils % 06/07/2022 48.9  % Final    Lymphocytes % 06/07/2022 40.4  % Final    Monocytes % 06/07/2022 7.7  % Final    Eosinophils % 06/07/2022 2.6  % Final    Basophils % 06/07/2022 0.4  % Final    Absolute Neutrophils 06/07/2022 2.9  1.8 - 7.8 10*9/L Final    Absolute Lymphocytes 06/07/2022 2.4  1.1 - 3.6 10*9/L Final    Absolute Monocytes 06/07/2022 0.5  0.3 - 0.8 10*9/L Final    Absolute Eosinophils 06/07/2022 0.2  0.0 - 0.5 10*9/L Final    Absolute Basophils 06/07/2022 0.0  0.0 - 0.1 10*9/L Final   Appointment on 04/27/2022   Component Date Value Ref Range Status    Albumin 04/27/2022 4.0  3.4 - 5.0 g/dL Final    Total Protein 04/27/2022 6.9  5.7 - 8.2 g/dL Final    Total Bilirubin 04/27/2022 0.7  0.3 - 1.2 mg/dL Final    Bilirubin, Direct 04/27/2022 0.30  0.00 - 0.30 mg/dL Final    AST 14/78/2956 27  <=34 U/L Final    ALT 04/27/2022 27  10 - 49 U/L Final    Alkaline Phosphatase 04/27/2022 84  46 - 116 U/L Final       PROBLEM LIST:    Patient Active Problem List   Diagnosis    Multiple sclerosis (CMS-HCC)    History of Latent TB         CURRENT MEDICATIONS:    Current Outpatient Medications   Medication Sig Dispense Refill    amantadine HCL (SYMMETREL) 100 mg capsule 100mg  ( 1 tablet) first thing in the morning. After a few days if not effective enough then take a second tablet around noontime. If ineffective in one week may increase to 200mg  ( 2 tablets)  twice a day. 60 capsule 5    cholecalciferol, vitamin D3-100 mcg, 4,000 unit,, 100 mcg (4,000 unit) Tab Take 1 tablet (100 mcg total) by mouth in the morning.      DULoxetine (CYMBALTA) 30 MG capsule Take 1 capsule (30 mg) by mouth once daily while tapering gabapentin (9 days), THEN increase to 2 capsules (60 mg) once daily. 60 capsule 3     No current facility-administered medications for this visit.       Past Surgical Hx:  No past surgical history on file.    Social Hx:    Social History     Socioeconomic History    Marital status: Married     Spouse name: None    Number of children: 5    Years of education: None    Highest education level: None   Occupational History    Occupation: Naval architect   Tobacco Use    Smoking status: Former     Packs/day: 0.40     Years: 1.50     Additional pack  years: 0.00     Total pack years: 0.60     Types: Cigarettes     Quit date: 11/02/2021     Years since quitting: 0.8    Smokeless tobacco: Never   Substance and Sexual Activity    Alcohol use: Not Currently    Drug use: Not Currently     Types: Marijuana    Sexual activity: Yes     Partners: Female     Social Determinants of Health     Transportation Needs: No Transportation Needs (06/01/2022)    PRAPARE - Therapist, art (Medical): No     Lack of Transportation (Non-Medical): No       Family Hx:    Family History   Problem Relation Age of Onset    Cancer Mother     Hypertension Sister     Aneurysm Cousin        ALLERGIES:  No Known Allergies

## 2022-09-21 NOTE — Unmapped (Addendum)
Below is your visit summary:    ** Cancel 11/17/2022 MRI's and Reschedule your MRI's 03/2023. Call [phone], option 1.     **Amantadine: Try taking 100 -200 mg (1-2) in the morning only and see how your sleep is.    -Reminder to stay up to date on all vaccines including Flu, updated 2023-2024 COVID vaccine and RSV and if not previously obtained the Shingles and Pneumonia vaccine. Stay current with all cancer screenings including dermatology, colonoscopy (Regular screening, beginning at age 89), and if you are a male: mammogram (ages 40 to 16 years ), pap smears per CDC guidelines for people under immunosuppression. This can be obtained or arrange through the Primary provider.   -Best to wait 2-3 months after last Ocrevus infusion for vaccine.    **Follow up 6 months.    Thank you for choosing [facility]  We appreciate the opportunity to participate in your care. If you have questions or concerns, please do not hesitate to contact us by [facility] or by calling [phone] to speak with one of our clinical support team members.      [facility] MyChart Website: [email or web address]      Appointment Scheduling: [phone]    Rebecka Oelkers Jannett Celestine PA-C, MPAS  Division of Multiple Sclerosis  [facility] at [facility]  [address]  [place], [place] [address]    Phone [phone]  Fax [phone]    Navigating the Challenges of MS: MS Navigator??   National MS Society: MS Navigator at [phone]  Finding answers and making decisions relies on having the right information at the right time. MS Navigator are highly skilled, compassionate professionals available Monday through Friday, 9:00 a.m. to 7:00 p.m. ET to connect you to the information, resources and support needed to move your life forward. These supportive partners help navigate the challenges of MS unique to your situation.    [facility] MS Clinic is a specialty clinic and there is a need for you to have a  primary care provider  who will take care of your non-neurological health.   Please set this up if you have not already done so.      In case of:  a suspected relapse (new symptoms or worsening existing symptoms, lasting for >24h)  OR  a need for an additional appointment for other reasons  OR   if you have other questions: please contact:       [facility] Desk   Phone: [phone]      Supportive Therapy, Patriciaann Clan, MSW, LCSW.        Phone [phone]    If you have questions for our  pharmacist, please call:      Worthy Flank, PharmD, CPP  Phone: [phone]      If you need financial assistance, please call:      Financial Assistance Unit      Phone: [phone]

## 2022-10-12 NOTE — Unmapped (Signed)
Per Yolande Jolly, have patient call scheduling team to schedule an urgent appointment for next week. Called patient. LVM, HIPAA compliant.

## 2022-10-12 NOTE — Unmapped (Signed)
Patient sent MyChart message with symptoms. Called patient.     SYMPTOM EVALUATION:    Patient reports feeling like his legs are going to give out on him. He feels like his limbs are about to move involuntary. Right leg does a round step when he tries to walk. Patient also has been having extreme issues with speaking as far as not knowing what words to use, as well as confusion when speaking and memory loss. Started a few months ago but getting worse last month. Symptoms are coming and going, but more frequent the past month. New symptoms of right leg not moving the way he wants it to. Denies URI/UTI/skin infection/changes in sleep pattern/falls.      DOES THE PATIENT HAVE ANY OF THE FOLLOWING:     Visual problems: constant blurry, double vision, not new, looks like pressure pushing down on eyes  Excessive fatigue: not new  Increase in stress: life is stressful, getting used to MS, special needs child at home  Memory or concentration: scares him, catches him off guard when he forgets things  Overheated: sometimes gets overheated in the house, feels hot and sweaty  Reduction in mobility: dropping things      Current MS medications: Ocrevus  New medications started recently: no  Received IV steroids in the past: yes     Routed to provider.

## 2022-10-18 ENCOUNTER — Ambulatory Visit: Admit: 2022-10-18 | Discharge: 2022-10-19 | Payer: PRIVATE HEALTH INSURANCE

## 2022-10-18 DIAGNOSIS — G35 Multiple sclerosis: Principal | ICD-10-CM

## 2022-10-18 NOTE — Unmapped (Unsigned)
Nathan Yu is a 36 y.o. male  with history of MS who presents for evaluation of double vision and double vision.    CC: Double vision      Timeline:   - 11/2021 vision loss when looking to the left  - blurry and double vision  - very mild intermittent esotropia during strabismus test    Past Medical History:  Past Medical History:   Diagnosis Date    Multiple sclerosis (CMS-HCC) 12/06/2021       Previous images:  MRI Brain W Wo Contrast 06/04/2022    Narrative  EXAM: Magnetic resonance imaging, brain without and with contrast material.  DATE: 06/04/2022 6:27 PM  ACCESSION: [id number]  DICTATED: 06/04/2022 6:31 PM  INTERPRETATION LOCATION: [facility]    CLINICAL INDICATION: 36 years old Male with concern for MS flair ; Multiple sclerosis (MS) ; Demyelinating disease ; Neuro deficit, acute, stroke suspected    COMPARISON: Outside brain MRI 11/28/2021    TECHNIQUE: Multiplanar, multisequence MR imaging of the brain was performed with and without I.V. contrast using our Multiple Sclerosis protocol.    FINDINGS:  There are multiple foci of T2/FLAIR hyperintensity in the cortical, juxtacortical and periventricular white matter which appear unchanged. Some of the lesions demonstrate corresponding T1 black holes consistent with axonal loss.    The right optic nerve is normal in size.  There is mild left optic nerve atrophy suggestive of prior optic neuritis.  No optic nerve enhancement. No diffuse brain volume loss. Ventricles are normal in size.    There is no evidence of intracranial hemorrhage, acute infarct, or mass.    Impression  Multiple white matter lesions in a distribution consistent with multiple sclerosis, unchanged. No new or enhancing lesions.      Notable Neurological Testing:  Color       Color         Right Left    [place] 11/11 11/11                    Images (October 18, 2022):    HVF OU: OD inferior scotoma, superior temporal scotoma ; OS WNL  OCT RNFL OU: OD 106; OS 111  OCT GCC OU: WNL    Assessment and plan:     - Double vision and blurry vision in a patient with history of MS. HVF shows OD inferior scotoma, superior temporal scotoma. All other neuroophthalmologic testing is normal  - Given prescription for new glasses today  - Strong return precautions discussed.     - Return if symptoms worsen or fail to improve.   - V/T/Color/HVF 24-2/RNFL/GCC    ___________________________________________________________________________________________________  Referral: Dr. Mariane Baumgarten    - I have reviewed the patient's past medical records from Dr. Mariane Baumgarten and the external record review is summarized below.   - I have personally spent {Time Spent:35953} face-to-face and non-face-to-face in the care of this patient, which includes all pre, intra, and post visit time on the date of service.   - I was present and involved in all parts of the service and the documentation is mine.   - I instructed patient to call [facility] at [phone] for questions/concerns or report immediately to the Emergency department.  - CC referring provider

## 2022-11-05 NOTE — Unmapped (Signed)
Specialty Medication(s): Rebif    Nathan Yu has been dis-enrolled from the [facility] specialty pharmacy services due to a change in therapy. The patient is now taking Ocrevus and is not filling at the [facility].    Additional information provided to the patient: n/a    [name], PharmD  [facility] Specialty Pharmacist

## 2022-11-15 ENCOUNTER — Ambulatory Visit: Admit: 2022-11-15 | Discharge: 2022-11-16 | Payer: PRIVATE HEALTH INSURANCE

## 2022-11-15 MED ORDER — LOSARTAN 25 MG TABLET
ORAL_TABLET | Freq: Every day | ORAL | 3 refills | 90 days | Status: CP
Start: 2022-11-15 — End: 2023-11-15

## 2022-11-21 ENCOUNTER — Ambulatory Visit
Admit: 2022-11-21 | Discharge: 2022-11-22 | Payer: PRIVATE HEALTH INSURANCE | Attending: Pharmacist Clinician (PhC)/ Clinical Pharmacy Specialist | Primary: Pharmacist Clinician (PhC)/ Clinical Pharmacy Specialist

## 2022-11-25 DIAGNOSIS — G35 Multiple sclerosis: Principal | ICD-10-CM

## 2022-11-25 MED ORDER — DULOXETINE 30 MG CAPSULE,DELAYED RELEASE
ORAL_CAPSULE | 3 refills | 0 days
Start: 2022-11-25 — End: ?

## 2022-11-26 MED ORDER — DULOXETINE 30 MG CAPSULE,DELAYED RELEASE
ORAL_CAPSULE | Freq: Every day | ORAL | 1 refills | 90 days | Status: CP
Start: 2022-11-26 — End: ?

## 2022-12-20 ENCOUNTER — Ambulatory Visit
Admit: 2022-12-20 | Discharge: 2022-12-21 | Payer: PRIVATE HEALTH INSURANCE | Attending: Physician Assistant | Primary: Physician Assistant

## 2022-12-20 DIAGNOSIS — G35 Multiple sclerosis: Principal | ICD-10-CM

## 2022-12-20 MED ORDER — AMANTADINE HCL 100 MG CAPSULE
ORAL_CAPSULE | Freq: Every morning | ORAL | 1 refills | 90 days | Status: CP
Start: 2022-12-20 — End: ?

## 2022-12-28 ENCOUNTER — Ambulatory Visit: Admit: 2022-12-28 | Discharge: 2022-12-29 | Payer: PRIVATE HEALTH INSURANCE

## 2022-12-28 MED ORDER — LOSARTAN 25 MG TABLET
ORAL_TABLET | Freq: Every day | ORAL | 0 refills | 90 days | Status: CP
Start: 2022-12-28 — End: 2023-03-28

## 2023-01-08 ENCOUNTER — Telehealth: Admit: 2023-01-08 | Payer: PRIVATE HEALTH INSURANCE

## 2023-01-08 ENCOUNTER — Ambulatory Visit: Admit: 2023-01-08 | Payer: PRIVATE HEALTH INSURANCE

## 2023-01-08 ENCOUNTER — Telehealth: Admit: 2023-01-08 | Discharge: 2023-02-06 | Payer: PRIVATE HEALTH INSURANCE

## 2023-01-16 ENCOUNTER — Ambulatory Visit: Admit: 2023-01-16 | Discharge: 2023-01-17 | Payer: PRIVATE HEALTH INSURANCE

## 2023-01-16 DIAGNOSIS — G35 Multiple sclerosis: Principal | ICD-10-CM

## 2023-01-16 DIAGNOSIS — R41841 Cognitive communication deficit: Principal | ICD-10-CM

## 2023-01-22 DIAGNOSIS — R41841 Cognitive communication deficit: Principal | ICD-10-CM

## 2023-01-22 DIAGNOSIS — G35 Multiple sclerosis: Principal | ICD-10-CM

## 2023-01-29 DIAGNOSIS — G35 Multiple sclerosis: Principal | ICD-10-CM

## 2023-01-29 DIAGNOSIS — R41841 Cognitive communication deficit: Principal | ICD-10-CM

## 2023-01-30 ENCOUNTER — Telehealth: Admit: 2023-01-30 | Discharge: 2023-01-31 | Payer: PRIVATE HEALTH INSURANCE

## 2023-02-04 DIAGNOSIS — G35 Multiple sclerosis: Principal | ICD-10-CM

## 2023-02-04 DIAGNOSIS — R41841 Cognitive communication deficit: Principal | ICD-10-CM

## 2023-02-12 ENCOUNTER — Ambulatory Visit: Admit: 2023-02-12 | Discharge: 2023-03-08 | Payer: PRIVATE HEALTH INSURANCE

## 2023-02-12 ENCOUNTER — Telehealth: Admit: 2023-02-12 | Payer: PRIVATE HEALTH INSURANCE

## 2023-02-12 DIAGNOSIS — G35 Multiple sclerosis: Principal | ICD-10-CM

## 2023-02-12 DIAGNOSIS — R41841 Cognitive communication deficit: Principal | ICD-10-CM

## 2023-02-13 ENCOUNTER — Telehealth: Admit: 2023-02-13 | Discharge: 2023-02-14 | Payer: PRIVATE HEALTH INSURANCE

## 2023-02-13 DIAGNOSIS — F4323 Adjustment disorder with mixed anxiety and depressed mood: Principal | ICD-10-CM

## 2023-02-19 DIAGNOSIS — G35 Multiple sclerosis: Principal | ICD-10-CM

## 2023-02-19 DIAGNOSIS — R41841 Cognitive communication deficit: Principal | ICD-10-CM

## 2023-03-16 ENCOUNTER — Ambulatory Visit: Admit: 2023-03-16 | Discharge: 2023-03-17 | Payer: PRIVATE HEALTH INSURANCE

## 2023-03-20 ENCOUNTER — Ambulatory Visit: Admit: 2023-03-20 | Payer: PRIVATE HEALTH INSURANCE

## 2023-03-27 ENCOUNTER — Telehealth: Admit: 2023-03-27 | Payer: PRIVATE HEALTH INSURANCE

## 2023-03-27 ENCOUNTER — Ambulatory Visit: Admit: 2023-03-27 | Payer: PRIVATE HEALTH INSURANCE

## 2023-03-27 DIAGNOSIS — G35 Multiple sclerosis: Principal | ICD-10-CM

## 2023-03-27 DIAGNOSIS — R41841 Cognitive communication deficit: Principal | ICD-10-CM

## 2023-04-02 ENCOUNTER — Ambulatory Visit
Admit: 2023-04-02 | Discharge: 2023-04-03 | Payer: PRIVATE HEALTH INSURANCE | Attending: Physician Assistant | Primary: Physician Assistant

## 2023-04-02 DIAGNOSIS — G35 Multiple sclerosis: Principal | ICD-10-CM

## 2023-04-02 MED ORDER — AMANTADINE HCL 100 MG CAPSULE
ORAL_CAPSULE | Freq: Every morning | ORAL | 1 refills | 90 days | Status: CP
Start: 2023-04-02 — End: ?

## 2023-04-02 MED ORDER — DULOXETINE 30 MG CAPSULE,DELAYED RELEASE
ORAL_CAPSULE | Freq: Every day | ORAL | 1 refills | 90 days | Status: CP
Start: 2023-04-02 — End: ?

## 2023-04-02 MED ORDER — DALFAMPRIDINE ER 10 MG TABLET,EXTENDED RELEASE,12 HR
ORAL_TABLET | Freq: Two times a day (BID) | ORAL | 1 refills | 90 days | Status: CP
Start: 2023-04-02 — End: ?
  Filled 2023-04-23: qty 60, 30d supply, fill #0

## 2023-04-03 DIAGNOSIS — I10 Essential (primary) hypertension: Principal | ICD-10-CM

## 2023-04-03 MED ORDER — LOSARTAN 25 MG TABLET
ORAL_TABLET | Freq: Every day | ORAL | 0 refills | 90 days | Status: CP
Start: 2023-04-03 — End: 2023-07-02

## 2023-04-04 LAB — VITAMIN D 25 HYDROXY: VITAMIN D, TOTAL (25OH): 18.1 ng/mL — ABNORMAL LOW (ref 20.0–80.0)

## 2023-04-04 NOTE — Unmapped (Signed)
[facility] Specialty Medication Onboarding    Specialty Medication: Dalfampridine  Prior Authorization: Not Required   Financial Assistance: No - copay  <$25  Final Copay/Day Supply: $4 / 30    Insurance Restrictions: Yes - max 1 month supply     Notes to Pharmacist: n/a    The triage team has completed the benefits investigation and has determined that the patient is able to fill this medication at [facility]. Please contact the patient to complete the onboarding or follow up with the prescribing physician as needed.

## 2023-04-05 NOTE — Unmapped (Signed)
[name]   Patient Onboarding/Medication Counseling    Nathan Yu is a 37 y.o. male with multiple sclerosis who I am counseling today on initiation of therapy.  I am speaking to the patient.    Was a Nurse, learning disability used for this call? No    Verified patient's date of birth / HIPAA.    Specialty medication(s) to be sent: Neurology: Dalfampridine      Non-specialty medications/supplies to be sent: n/a      Medications not needed at this time: n/a         Ampyra (dalfampridine)    The patient declined counseling on side effects and monitoring parameters, warnings and precautions, and drug/food interactions because they were counseled in clinic. The information in the declined sections below are for informational purposes only and was not discussed with patient.       Medication & Administration     Dosage: Take 1 tablet (10mg ) by mouth every 12 hours.    Administration: Take by mouth without regards to meals. Do not crush, chew, dissolve or divide tablet.    Adherence/Missed dose instructions: If you miss a dose, skip the missed dose and resume your normal schedule.    Goals of Therapy     To improve walking/gait    Side Effects & Monitoring Parameters   Upset stomach  Constipation  Dizziness  Headache  Fatigue  Difficulty sleeping  Back pain  Nose and throat irritation    The following side effects should be reported to the provider:  Signs of an allergic reaction  Signs of a UTI (blood in urine, buring or pain with urination, increased urinary frequency/urgency, fever, low stomach or pelvic pain)  Severe dizziness or passing out  Severe fatigue  Shortness of breath  Changes in balance  Burning, numbness or tingling that is not normal  Seizures       Contraindications, Warnings & Precautions     Contraindications:  History of seizures  Moderate or severe renal impairment (CrCl <=21ml/min)  Seizures (discontinue use and do not reinitiate if seizures occur)    Drug/Food Interactions Medication list reviewed in Epic. The patient was instructed to inform the care team before taking any new medications or supplements. No drug interactions identified.        Storage, Handling Precautions, & Disposal   Store Ampyra at room temperature in the original labelled container.      Current Medications (including OTC/herbals), Comorbidities and Allergies     Current Outpatient Medications   Medication Sig Dispense Refill    amantadine HCL (SYMMETREL) 100 mg capsule Take 2 capsules (200 mg total) by mouth every morning. 180 capsule 1    cholecalciferol, vitamin D3-100 mcg, 4,000 unit,, 100 mcg (4,000 unit) Tab Take 1 tablet (100 mcg total) by mouth in the morning.      dalfampridine 10 mg Tb12 Take 1 tablet (10 mg total) by mouth every twelve (12) hours. 180 tablet 1    DULoxetine (CYMBALTA) 30 MG capsule Take 2 capsules (60 mg total) by mouth daily. 180 capsule 1    losartan (COZAAR) 25 MG tablet Take 2 tablets (50 mg total) by mouth daily. 180 tablet 0    OCREVUS 30 mg/mL injection        No current facility-administered medications for this visit.       No Known Allergies    Patient Active Problem List   Diagnosis    Multiple sclerosis (CMS-HCC)    Mild major depression (  CMS-HCC)    History of Latent TB    Primary hypertension       Reviewed and up to date in Epic.    Appropriateness of Therapy     Acute infections noted within Epic:  No active infections  Patient reported infection: None    Is medication and dose appropriate based on diagnosis and infection status? Yes    Prescription has been clinically reviewed: Yes      Baseline Quality of Life Assessment      How many days over the past month did your MS  keep you from your normal activities? For example, brushing your teeth or getting up in the morning. 0    Financial Information     Medication Assistance provided: None Required    Anticipated copay of $4 reviewed with patient. Verified delivery address.    Delivery Information     Scheduled delivery date: 04/24/23    Expected start date: 04/24/23      Medication will be delivered via Next Day Courier to the prescription address in [facility]  This shipment will not require a signature.      Explained the services we provide at [facility] and that each month we would call to set up refills.  Stressed importance of returning phone calls so that we could ensure they receive their medications in time each month.  Informed patient that we should be setting up refills 7-10 days prior to when they will run out of medication.  A pharmacist will reach out to perform a clinical assessment periodically.  Informed patient that a welcome packet, containing information about our pharmacy and other support services, a Notice of Privacy Practices, and a drug information handout will be sent.      The patient or caregiver noted above participated in the development of this care plan and knows that they can request review of or adjustments to the care plan at any time.      Patient or caregiver verbalized understanding of the above information as well as how to contact the pharmacy at [phone] option 4 with any questions/concerns.  The pharmacy is open Monday through Friday 8:30am-4:30pm.  A pharmacist is available 24/7 via pager to answer any clinical questions they may have.    Patient Specific Needs     Does the patient have any physical, cognitive, or cultural barriers? No    Does the patient have adequate living arrangements? (i.e. the ability to store and take their medication appropriately) Yes    Did you identify any home environmental safety or security hazards? No    Patient prefers to have medications discussed with  Patient     Is the patient or caregiver able to read and understand education materials at a high school level or above? Yes    Patient's primary language is  English     Is the patient high risk? No    SOCIAL DETERMINANTS OF HEALTH     At the [facility], we have learned that life circumstances - like trouble affording food, housing, utilities, or transportation can affect the health of many of our patients.   That is why we wanted to ask: are you currently experiencing any life circumstances that are negatively impacting your health and/or quality of life? Patient declined to answer    Social Determinants of Health     Financial Resource Strain: Low Risk  (01/30/2023)    Overall Financial Resource Strain (CARDIA)  Difficulty of Paying Living Expenses: Not hard at all   Internet Connectivity: Not on file   Food Insecurity: Food Insecurity Present (01/30/2023)    Hunger Vital Sign     Worried About Running Out of Food in the Last Year: Sometimes true     Ran Out of Food in the Last Year: Never true   Tobacco Use: Medium Risk (04/02/2023)    Patient History     Smoking Tobacco Use: Former     Smokeless Tobacco Use: Never     Passive Exposure: Not on file   Housing/Utilities: Low Risk  (01/30/2023)    Housing/Utilities     Within the past 12 months, have you ever stayed: outside, in a car, in a tent, in an overnight shelter, or temporarily in someone else's home (i.e. couch-surfing)?: No     Are you worried about losing your housing?: No     Within the past 12 months, have you been unable to get utilities (heat, electricity) when it was really needed?: No   Alcohol Use: Unknown (01/20/2019)    Received from [facility]    AUDIT-C     Frequency of Alcohol Consumption: 2-4 times a month     Average Number of Drinks: 1 or 2     Frequency of Binge Drinking: Not on file   Transportation Needs: Unmet Transportation Needs (01/30/2023)    PRAPARE - Therapist, art (Medical): Yes     Lack of Transportation (Non-Medical): Yes   Substance Use: Not on file   Health Literacy: Not on file   Physical Activity: Not on file   Interpersonal Safety: Not on file   Stress: Not on file   Intimate Partner Violence: Not on file   Depression: At risk (12/06/2021)    PHQ-2     PHQ-2 Score: 5   Social Connections: Not on file       Would you be willing to receive help with any of the needs that you have identified today? Not applicable       [name], PharmD  [facility] Specialty Pharmacist

## 2023-04-27 MED ORDER — CHOLECALCIFEROL (VITAMIN D3) 1,250 MCG (50,000 UNIT) CAPSULE
ORAL_CAPSULE | ORAL | 0 refills | 168 days | Status: CP
Start: 2023-04-27 — End: ?

## 2023-05-22 NOTE — Unmapped (Signed)
[facility] Clinical Assessment & Refill Coordination Note    Nathan Yu, [place]: 09-Apr-1986  Phone: [phone] (home)     All above HIPAA information was verified with patient.     Was a Nurse, learning disability used for this call? No    Specialty Medication(s):   Neurology: Dalfampridine     Current Outpatient Medications   Medication Sig Dispense Refill    amantadine HCL (SYMMETREL) 100 mg capsule Take 2 capsules (200 mg total) by mouth every morning. 180 capsule 1    cholecalciferol, vitamin D3-1,250 mcg, 50,000 unit,, 1,250 mcg (50,000 unit) capsule Take 1 capsule (1,250 mcg total) by mouth once a week. 24 capsule 0    dalfampridine 10 mg Tb12 Take 1 tablet (10 mg total) by mouth every twelve (12) hours. 180 tablet 1    DULoxetine (CYMBALTA) 30 MG capsule Take 2 capsules (60 mg total) by mouth daily. 180 capsule 1    losartan (COZAAR) 25 MG tablet Take 2 tablets (50 mg total) by mouth daily. 180 tablet 0    OCREVUS 30 mg/mL injection        No current facility-administered medications for this visit.        Changes to medications: Khaden reports no changes at this time.    No Known Allergies    Changes to allergies: No    SPECIALTY MEDICATION ADHERENCE     dalfampridine 10 mg: ~10 days of medicine on hand            Specialty medication(s) dose(s) confirmed: Regimen is correct and unchanged.     Are there any concerns with adherence? No    Adherence counseling provided? Not needed    CLINICAL MANAGEMENT AND INTERVENTION      Clinical Benefit Assessment:    Do you feel the medicine is effective or helping your condition?  Help    Clinical Benefit counseling provided? Not needed    Adverse Effects Assessment:    Are you experiencing any side effects? No    Are you experiencing difficulty administering your medicine? No    Quality of Life Assessment:    Quality of Life    Rheumatology  Oncology  Dermatology  Cystic Fibrosis          How many days over the past month did your MS keep you from your normal activities? For example, brushing your teeth or getting up in the morning. 0    Have you discussed this with your provider? Not needed    Acute Infection Status:    Acute infections noted within Epic:  No active infections  Patient reported infection: None    Therapy Appropriateness:    Is therapy appropriate and patient progressing towards therapeutic goals? Yes, therapy is appropriate and should be continued    DISEASE/MEDICATION-SPECIFIC INFORMATION      N/A    Multiple Sclerosis: Have you experienced any flares in the last month? No  Has this been reported to your provider? Not applicable  What was the outcome of the flare? Not applicable    PATIENT SPECIFIC NEEDS     Does the patient have any physical, cognitive, or cultural barriers? No    Is the patient high risk? No    Did the patient require a clinical intervention? No    Does the patient require physician intervention or other additional services (i.e., nutrition, smoking cessation, social work)? No    SOCIAL DETERMINANTS OF HEALTH     At the [facility], we have learned that life circumstances - like trouble affording food, housing, utilities, or transportation can affect the health of many of our patients.   That is why we wanted to ask: are you currently experiencing any life circumstances that are negatively impacting your health and/or quality of life? Patient declined to answer    Social Determinants of Health     Financial Resource Strain: Low Risk  (01/30/2023)    Overall Financial Resource Strain (CARDIA)     Difficulty of Paying Living Expenses: Not hard at all   Internet Connectivity: Not on file   Food Insecurity: Food Insecurity Present (01/30/2023)    Hunger Vital Sign     Worried About Running Out of Food in the Last Year: Sometimes true     Ran Out of Food in the Last Year: Never true   Tobacco Use: Medium Risk (04/02/2023)    Patient History     Smoking Tobacco Use: Former     Smokeless Tobacco Use: Never Passive Exposure: Not on file   Housing/Utilities: Low Risk  (01/30/2023)    Housing/Utilities     Within the past 12 months, have you ever stayed: outside, in a car, in a tent, in an overnight shelter, or temporarily in someone else's home (i.e. couch-surfing)?: No     Are you worried about losing your housing?: No     Within the past 12 months, have you been unable to get utilities (heat, electricity) when it was really needed?: No   Alcohol Use: Unknown (01/20/2019)    Received from [facility]    AUDIT-C     Frequency of Alcohol Consumption: 2-4 times a month     Average Number of Drinks: 1 or 2     Frequency of Binge Drinking: Not on file   Transportation Needs: Unmet Transportation Needs (01/30/2023)    PRAPARE - Therapist, art (Medical): Yes     Lack of Transportation (Non-Medical): Yes   Substance Use: Not on file   Health Literacy: Not on file   Physical Activity: Not on file   Interpersonal Safety: Not on file   Stress: Not on file   Intimate Partner Violence: Not on file   Depression: At risk (12/06/2021)    PHQ-2     PHQ-2 Score: 5   Social Connections: Not on file       Would you be willing to receive help with any of the needs that you have identified today? Not applicable       SHIPPING     Specialty Medication(s) to be Shipped:   Neurology: Dalfampridine    Other medication(s) to be shipped: No additional medications requested for fill at this time     Changes to insurance: No    Delivery Scheduled: Yes, Expected medication delivery date: 05/29/23.     Medication will be delivered via Next Day Courier to the confirmed prescription address in [facility].    The patient will receive a drug information handout for each medication shipped and additional FDA Medication Guides as required.  Verified that patient has previously received a Conservation officer, historic buildings and a Surveyor, mining.    The patient or caregiver noted above participated in the development of this care plan and knows that they can request review of or adjustments to the care plan at any time.      All of the patient's questions and concerns have been addressed.  Arlana Lindau, PharmD   [facility] Specialty Pharmacist

## 2023-05-28 MED FILL — DALFAMPRIDINE ER 10 MG TABLET,EXTENDED RELEASE,12 HR: ORAL | 30 days supply | Qty: 60 | Fill #1

## 2023-06-07 ENCOUNTER — Ambulatory Visit: Admit: 2023-06-07 | Discharge: 2023-06-08 | Payer: PRIVATE HEALTH INSURANCE

## 2023-06-07 DIAGNOSIS — G35 Multiple sclerosis: Principal | ICD-10-CM

## 2023-06-07 LAB — CBC W/ AUTO DIFF
BASOPHILS ABSOLUTE COUNT: 0 10*9/L (ref 0.0–0.1)
BASOPHILS RELATIVE PERCENT: 0.6 %
EOSINOPHILS ABSOLUTE COUNT: 0.1 10*9/L (ref 0.0–0.5)
EOSINOPHILS RELATIVE PERCENT: 1.5 %
HEMATOCRIT: 42.9 % (ref 39.0–48.0)
HEMOGLOBIN: 14.4 g/dL (ref 12.9–16.5)
LYMPHOCYTES ABSOLUTE COUNT: 2 10*9/L (ref 1.1–3.6)
LYMPHOCYTES RELATIVE PERCENT: 24.6 %
MEAN CORPUSCULAR HEMOGLOBIN CONC: 33.7 g/dL (ref 32.0–36.0)
MEAN CORPUSCULAR HEMOGLOBIN: 31.1 pg (ref 25.9–32.4)
MEAN CORPUSCULAR VOLUME: 92.3 fL (ref 77.6–95.7)
MEAN PLATELET VOLUME: 8.9 fL (ref 6.8–10.7)
MONOCYTES ABSOLUTE COUNT: 0.5 10*9/L (ref 0.3–0.8)
MONOCYTES RELATIVE PERCENT: 6.6 %
NEUTROPHILS ABSOLUTE COUNT: 5.3 10*9/L (ref 1.8–7.8)
NEUTROPHILS RELATIVE PERCENT: 66.7 %
PLATELET COUNT: 291 10*9/L (ref 150–450)
RED BLOOD CELL COUNT: 4.65 10*12/L (ref 4.26–5.60)
RED CELL DISTRIBUTION WIDTH: 12.8 % (ref 12.2–15.2)
WBC ADJUSTED: 8 10*9/L (ref 3.6–11.2)

## 2023-06-07 LAB — HEPATIC FUNCTION PANEL
ALBUMIN: 4.1 g/dL (ref 3.4–5.0)
ALKALINE PHOSPHATASE: 84 U/L (ref 46–116)
ALT (SGPT): 40 U/L (ref 10–49)
AST (SGOT): 21 U/L (ref <=34)
BILIRUBIN DIRECT: 0.1 mg/dL (ref 0.00–0.30)
BILIRUBIN TOTAL: 0.4 mg/dL (ref 0.3–1.2)
PROTEIN TOTAL: 7.1 g/dL (ref 5.7–8.2)

## 2023-06-28 NOTE — Unmapped (Signed)
[facility] Pharm[facility] Refill Coordination Note    Specialty Medication(s) to be Shipped:   Neurology: Dalfampridine    Other medication(s) to be shipped: No additional medications requested for fill at this time     Nathan Yu, DOB: January 08, 1986  Phone: [phone] (home)       All above HIPAA information was verified with patient.     Was a Nurse, learning disability used for this call? No    Completed refill call assessment today to schedule patient's medication shipment from the [facility] [phone]).  All relevant notes have been reviewed.     Specialty medication(s) and dose(s) confirmed: Regimen is correct and unchanged.   Changes to medications: Oluwatamilore reports no changes at this time.  Changes to insurance: No  New side effects reported not previously addressed with a pharmacist or physician: Yes - Patient reports increased bowel movements and headaches. Patient would not like to speak to the pharmacist today. Their provider is not aware. I advised patient to make provider aware since he didn't want to speak with a pharmacist at this time.   Questions for the pharmacist: No    Confirmed patient received a Conservation officer, historic buildings and a Surveyor, mining with first shipment. The patient will receive a drug information handout for each medication shipped and additional FDA Medication Guides as required.       DISEASE/MEDICATION-SPECIFIC INFORMATION        N/A    SPECIALTY MEDICATION ADHERENCE     Medication Adherence    Patient reported X missed doses in the last month: 0  Specialty Medication: dalfampridine 10 mg  Informant: patient  Confirmed plan for next specialty medication refill: delivery by pharmacy  Refills needed for supportive medications: not needed          Refill Coordination    Has the Patients' Contact Information Changed: No  Is the Shipping Address Different: No         Were doses missed due to medication being on hold? No    Dalfampridine 10 mg: 6 days of medicine on hand       REFERRAL TO PHARMACIST     Referral to the pharmacist: Not needed      [facility]     Shipping address confirmed in Epic.       Delivery Scheduled: Yes, Expected medication delivery date: 07/02/2023.     Medication will be delivered via Next Day Courier to the prescription address in Epic WAM.    Racheal Patches Pharmacy Candidate  [facility] Specialty

## 2023-07-01 MED FILL — DALFAMPRIDINE ER 10 MG TABLET,EXTENDED RELEASE,12 HR: ORAL | 30 days supply | Qty: 60 | Fill #2

## 2023-07-31 NOTE — Unmapped (Signed)
[facility] Refill Coordination Note    Specialty Medication(s) to be Shipped:   Neurology: Dalfampridine    Other medication(s) to be shipped: No additional medications requested for fill at this time     Nathan Yu, DOB: 30-Jun-1986  Phone: [phone] (home)       All above HIPAA information was verified with patient's family member, Wife  .     Was a Nurse, learning disability used for this call? No    Completed refill call assessment today to schedule patient's medication shipment from the [facility] [phone]).  All relevant notes have been reviewed.     Specialty medication(s) and dose(s) confirmed: Regimen is correct and unchanged.   Changes to medications: Vollie reports no changes at this time.  Changes to insurance: No  New side effects reported not previously addressed with a pharmacist or physician: None reported  Questions for the pharmacist: No    Confirmed patient received a Conservation officer, historic buildings and a Surveyor, mining with first shipment. The patient will receive a drug information handout for each medication shipped and additional FDA Medication Guides as required.       DISEASE/MEDICATION-SPECIFIC INFORMATION        N/A    SPECIALTY MEDICATION ADHERENCE     Medication Adherence    Patient reported X missed doses in the last month: 0  Specialty Medication: dalfampridine 10 mg  Patient is on additional specialty medications: No  Patient is on more than two specialty medications: No  Any gaps in refill history greater than 2 weeks in the last 3 months: no  Demonstrates understanding of importance of adherence: yes              Were doses missed due to medication being on hold? No        dalfampridine 10 mg Tb12 : 2  days of medicine on hand        REFERRAL TO PHARMACIST     Referral to the pharmacist: Not needed      [facility]     Shipping address confirmed in Epic.       Delivery Scheduled: Yes, Expected medication delivery date: 08/01/23 .     Medication will be delivered via Same Day Courier to the prescription address in Epic WAM.    Ricci Barker   [facility] Specialty Technician

## 2023-08-01 MED FILL — DALFAMPRIDINE ER 10 MG TABLET,EXTENDED RELEASE,12 HR: ORAL | 30 days supply | Qty: 60 | Fill #3

## 2023-08-28 NOTE — Unmapped (Signed)
[facility] Refill Coordination Note    Specialty Medication(s) to be Shipped:   Neurology: Dalfampridine    Other medication(s) to be shipped: No additional medications requested for fill at this time     Nathan Yu, DOB: [birth year]  Phone: [phone] (home)       All above HIPAA information was verified with patient.     Was a Nurse, learning disability used for this call? No    Completed refill call assessment today to schedule patient's medication shipment from the [facility] [phone]).  All relevant notes have been reviewed.     Specialty medication(s) and dose(s) confirmed: Regimen is correct and unchanged.   Changes to medications: Nathan Yu reports no changes at this time.  Changes to insurance: No  New side effects reported not previously addressed with a pharmacist or physician: None reported  Questions for the pharmacist: No    Confirmed patient received a Conservation officer, historic buildings and a Surveyor, mining with first shipment. The patient will receive a drug information handout for each medication shipped and additional FDA Medication Guides as required.       DISEASE/MEDICATION-SPECIFIC INFORMATION        N/A    SPECIALTY MEDICATION ADHERENCE     Medication Adherence    Patient reported X missed doses in the last month: 0  Specialty Medication: dalfampridine 10 mg Tb12  Patient is on additional specialty medications: No  Patient is on more than two specialty medications: No  Any gaps in refill history greater than 2 weeks in the last 3 months: no  Demonstrates understanding of importance of adherence: yes              Were doses missed due to medication being on hold? No        dalfampridine 10 mg Tb12 : 2  days of medicine on hand        REFERRAL TO PHARMACIST     Referral to the pharmacist: Not needed      [facility]     Shipping address confirmed in Epic.       Delivery Scheduled: Yes, Expected medication delivery date: 08/29/23 .     Medication will be delivered via Same Day Courier to the prescription address in Epic WAM.    Nathan Yu   [facility] Specialty Technician

## 2023-08-29 MED FILL — DALFAMPRIDINE ER 10 MG TABLET,EXTENDED RELEASE,12 HR: ORAL | 30 days supply | Qty: 60 | Fill #4

## 2023-09-05 NOTE — Unmapped (Signed)
I efaxed Ocrevus treatment plan, signed by Yolande Jolly, to:    [facility]    [phone] Phone  [phone] Fax    Apparently the fax did not go through.  I called [facility] and was given an alternate fax #  [phone]    I sent signed POT and last clinic note by efax to the alternate fax# above.    Efax is not working properly, so also sent by physical fax machine to the above.  Fax confirmation was received today at 15:00  and signed POT is uploaded into Media.

## 2023-09-17 NOTE — Unmapped (Signed)
FAX CONFIRMATION     Orders/documents that were faxed: Ocrevus Orders, signed  Facility/location it was faxed to: [facility]  Phone:  Fax: [phone]  Time sent: 0930  Fax confirmation received and document already scanned into media.

## 2023-09-19 NOTE — Unmapped (Signed)
[facility] Refill Coordination Note    Nathan Yu, [place]: 06-10-1986  Phone: [phone] (home)       All above HIPAA information was verified with patient.         09/19/2023     8:18 AM   Specialty Rx Medication Refill Questionnaire   Which Medications would you like refilled and shipped? Dalfanpridine   If medication refills are not needed at this time, please indicate the reason below. N/A   Please list all current allergies: N/ A   Have you missed any doses in the last 30 days? No   Have you had any changes to your medication(s) since your last refill? No   How many days remaining of each medication do you have at home? 4  days   If receiving an injectable medication, next injection date is 09/19/2023   Have you experienced any side effects in the last 30 days? No   Please enter the full address (street address, city, state, zip code) where you would like your medication(s) to be delivered to. [address] [place] [address], By  09/24/23 anytime   Please specify on which day you would like your medication(s) to arrive. Note: if you need your medication(s) within 3 days, please call the pharmacy to schedule your order at [phone]  09/23/2023   Has your insurance changed since your last refill? No   Would you like a pharmacist to call you to discuss your medication(s)? No   Do you require a signature for your package? (Note: if we are billing Medicare Part B or your order contains a controlled substance, we will require a signature) No         Completed refill call assessment today to schedule patient's medication shipment from the [facility] [phone]).  All relevant notes have been reviewed.       Confirmed patient received a Conservation officer, historic buildings and a Surveyor, mining with first shipment. The patient will receive a drug information handout for each medication shipped and additional FDA Medication Guides as required. REFERRAL TO PHARMACIST     Referral to the pharmacist: Not needed      [facility]     Shipping address confirmed in Epic.     Delivery Scheduled: Yes, Expected medication delivery date: 09/23/23 .     Medication will be delivered via Same Day Courier to the prescription address in Epic WAM.    Nathan Yu   [facility] Specialty Technician

## 2023-09-23 MED FILL — DALFAMPRIDINE ER 10 MG TABLET,EXTENDED RELEASE,12 HR: ORAL | 30 days supply | Qty: 60 | Fill #5

## 2023-10-02 ENCOUNTER — Ambulatory Visit
Admit: 2023-10-02 | Discharge: 2023-10-03 | Payer: PRIVATE HEALTH INSURANCE | Attending: Physician Assistant | Primary: Physician Assistant

## 2023-10-02 DIAGNOSIS — G35 Multiple sclerosis: Principal | ICD-10-CM

## 2023-10-02 MED ORDER — DULOXETINE 30 MG CAPSULE,DELAYED RELEASE
ORAL_CAPSULE | Freq: Every day | ORAL | 1 refills | 90 days | Status: CP
Start: 2023-10-02 — End: ?

## 2023-10-02 MED ORDER — AMANTADINE HCL 100 MG CAPSULE
ORAL_CAPSULE | Freq: Two times a day (BID) | ORAL | 1 refills | 45 days | Status: CP
Start: 2023-10-02 — End: ?

## 2023-10-02 MED ORDER — DALFAMPRIDINE ER 10 MG TABLET,EXTENDED RELEASE,12 HR
ORAL_TABLET | Freq: Two times a day (BID) | ORAL | 1 refills | 90 days | Status: CP
Start: 2023-10-02 — End: ?
  Filled 2023-10-30: qty 60, 30d supply, fill #0

## 2023-10-02 NOTE — Unmapped (Signed)
After obtaining consent, and per orders of Yolande Jolly, PA injection of Flulaval given by Lucia Gaskins, CMA. Patient instructed to remain in clinic for 20 minutes afterwards, and to report any adverse reaction to me immediately.

## 2023-10-02 NOTE — Unmapped (Addendum)
Below is your visit summary:    -Schedule your MRI's 03/2024.    -Pemgarda: Pre-exposure prophylaxis (PrEP) medication to protect immunocompromised people against COVID-19. This medication is approved by the FDA for emergency use authorization only (EUA).    [email or web address].    -Reminder to stay up to date on all vaccines including Flu, and updated 2024-2025 COVID vaccine and if not previously obtained the Shingles and Pneumonia vaccine.   Stay current with all standard of care per age cancer screenings including dermatology, colonoscopy (Regular screening, beginning at age 48), and if you are a male: mammogram (ages 37 to 36 years ), pap smears . This can be obtained or arrange through the Primary provider.     **Follow up 6-7 months,   Dr. Kelle Darting at the [facility]. After the MRI's    Thank you for choosing [facility]  We appreciate the opportunity to participate in your care. If you have questions or concerns, please do not hesitate to contact us by [facility] or by calling   Appointment Scheduling: [phone] to speak with one of our clinical support team members.      [facility] MyChart Website: [email or web address]      Willoughby Doell Jannett Celestine PA-C, MPAS  Division of Multiple Sclerosis  The [facility] at [facility]  [address]  [place], [place] [address]    Fax [phone]     In case of:  a suspected relapse (new symptoms or worsening existing symptoms, lasting for >24h)  OR  a need for an additional appointment for other reasons  OR   if you have other questions: please contact:       [facility] Desk   Phone: [phone]      [facility], Patriciaann Clan, MSW, LCSW.        Phone [phone]    If you have questions for our  pharmacist, please call:      Worthy Flank, PharmD, CPP  Phone: [phone]      If you need financial assistance, please call: Financial Assistance Unit      Phone: [phone]     Navigating the Challenges of MS: MS Navigator??   National MS Society: MS Navigator at [phone]  Finding answers and making decisions relies on having the right information at the right time. MS Navigator are highly skilled, compassionate professionals available Monday through Friday, 9:00 a.m. to 7:00 p.m. ET to connect you to the information, resources and support needed to move your life forward. These supportive partners help navigate the challenges of MS unique to your situation.    [facility] MS Clinic is a specialty clinic and there is a need for you to have a  primary care provider  who will take care of your non-neurological health.   Please set this up if you have not already done so.

## 2023-10-02 NOTE — Unmapped (Signed)
[facility] at [facility]  Multiple Sclerosis / Neuroimmunology Division  Maki Hege Julieanne Cotton [facility]  Physician Assistant    Phone: [phone]  Fax: [phone]      Patient Name: Nathan Yu [facility]   Date of Birth: 11/21/1986  Medical Record Number: [id number]  [address]  [place] [address]     Direct entry by:  Cy Blamer, PA-C.    DATE OF VISIT: October 02, 2023    REASON FOR VISIT: Followup in the Neuroimmunology Clinic for evaluation of Multiple Sclerosis / Demyelinating Disease / or other Neurological problem.    I personally spent 40 minutes face-to-face and non-face-to-face in the care of this patient, which includes all pre, intra, and post visit time on the date of service.  All documented time was specific to the E/M visit and does not include any procedures that may have been performed.    During this visit the diagnostic and treatment plan were discussed with the patient in the shared decision making process, Treatment goals and medication safety profile have been discussed with the patient.        Last evaluated by Dr. Trisha Mangle 02/01/2022.  Refer to this note for clinical details of prior history which I personally reviewed.    ASSESSMENT AND PLAN:  A 37 y.o. Hispanic male who has Multiple sclerosis (CMS-HCC); Mild major depression (CMS-HCC); History of Latent TB; and Primary hypertension on their problem list.    ** Relapsing Remitting Multiple Sclerosis:  -Ocrevus induction 08/09/2022, 09/13/2022. Infusion center in [place], [place]. Last infusion 09/19/2023.  -Reviewed from 09/19/2023, (in media tab), IgG, CBC/diff = wnl, 04/23 /2024 Vitamin D 25-OH = 18.1 ng/mL (L).  -Order yearly MRI of the  Brain, Cervical , and Thoracic spine with / without contrast  Schedule for  03/2024.    -Discussed Pemgarda: Pre-exposure prophylaxis (PrEP) medication to protect immunocompromised people against COVID-19. This medication is approved by the FDA for emergency use authorization only (EUA).    -Reminder to stay up to date on all vaccines including Flu, and updated 2024-2025 COVID vaccine and if not previously obtained the Shingles and Pneumonia vaccine. Stay current with all standard of care per age cancer screenings including dermatology, colonoscopy (Regular screening, beginning at age 36), and if you are a male: mammogram (ages 34 to 80 years ), pap smears . This can be obtained or arrange through the Primary provider.     **Fatigue, genera:  - Continue / Refill Amantadine 100 mg BID.    **Motor fatigue;  -Continue Ampyra 10 BID, no history of seizures, CR= wnl.  -Trial of holding Amypra to see if he can tell if it is making a difference.    **Neuropathic symptoms and depression:  -Continue / refill Cymbalta 60 mg daily. Gabapentin  made his speech worse.   -Refer to LCSW for counseling.  -Patient would like to hold on any more medications for depression.    **Cognitive decline:  --Re-refer to Speech therapy for continued cognitive rehab.    **Neuropathic bladder:  -Monitor. Discussed Oxybutynin.    -Flu vaccine today.    **Follow up 6-7 months,  Dr. Kelle Darting at the [facility].    Comprehensive testing table    DATE EDSS       T25FW SDM points 9PEG, right    Left    04/02/2023 2.5  4.66s.   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    INTERVAL HISTORY / CHIEF COMPLAINT :  Date new DMT was started Ocrevus induction 08/09/2022, 09/13/2022.  MS relapse since the start of DMD, none.  If yes, dates of relapses, relapse symptoms and treatment: na.  Progression since the start on new DMD, Balance.    Current symptoms: (If blank =  none).  Bladder / bowel / sexual function: Urgency,  only when he drinks a lot of water, like two water bottles. no incontinence, no leakage no emptying problems or UTI's.  No dysuria, hematuria.  Fatigue: Feels more tired in the morning than when he went to bed which started 01/2022. Sleep study two years ago. Lost some weight. Also having fatigue during the day which interferers with ADL's >50 %. Amantadine 100 mg BID is helping. Walking fatigue.  Depression / decrease in mentation / Euphoria:  PHQ9 = 10, denies SI/HI. Feels depressed some days. Cymbalta helps some. Does not want to add any more medication at this time.    Vision/double vision: Trouble reading, has to gets close, vision foggy = improved. 10/18/2022 Neuro-Ophthalmology appt=  HVF shows OD inferior scotoma, superior temporal scotoma. All other neuroophthalmologic testing is normal   Speech, swallowing problems: slurred speech at times. Speech therapy  has helped.  Weakness:  Legs are weak, started since diagnosis.Takes less effort if he limps = improved. Cymbalta is helping this.  Tingling/numbness/pain: Was taking Gabapentin 300 mg - 600 mg but made his speech worse. New left thigh with numbness, tingling and burning, happens daily but comes and goes. Prefers to monitor. Out of Cymbalta.  Memory, mood: Speech therapy helping with memory. Still having problems with memory. Seen by Speech therapy, uses their stragiey  .  Gait: Feels like his balance is off ,  needs to sit down at times  Falls:  Headaches:   Seizures:  Other symptoms: Daughter  has had a trach since birth. Increase in stress.      VITAL SIGNS  BP 166/87 (BP Site: R Arm, BP Position: Sitting, BP Cuff Size: Medium)  - Pulse 69  - Temp 36.8 ??C (98.2 ??F) (Temporal)  - Ht 167.6 cm (5' 6)  - Wt (!) 108.9 kg (240 lb)  - BMI 38.74 kg/m??     PHYSICAL EXAMINATION:  GENERAL:  Alert and oriented to person, place, time and situation.      Neurological Examination:   Cranial Nerves:   II, III- Pupils are equal 2 mm and reactive to light b/l.    III, IV, VI- extra ocular movements are intact, No ptosis, no nystagmus.  V- sensation of the face intact b/l.  VII- face symmetrical, no facial droop, normal facial movements with smile/grimace  VIII- Hearing grossly intact.  IX and X- symmetric palate contraction.  XI- Full shoulder shrug bilaterally  XII- Tongue protrudes midline, full range of movements of the tongue      Motor Exam:      Muscles UEs   LEs     R L   R L   Deltoids 5/5 5/5 Hip flexors  5/5 5/5   Biceps 5/5 5/5 Hip extensors 5/5 5/5   Triceps 5/5 5/5 Knee flexors 5/5 5/5   Hand grip 5/5 5/5 Knee extensors 5/5 5/5  Wrist flexors    5/5 5/5 Foot dorsal flexors 5/5 5/5   Wrist extensors 5/5 5/5 Foot plantar flexors 5/5 5/5   Finger flexors 5/5 5/5      Finger extensors 5/5 5/5            Normal bulk and tone. No spasticity.  No clonus.    Reflexes R L   Brachioradialis +2 +2   Biceps +2 +2   Triceps +2 +2   Patella +2 +2   Achilles +1 +1     Negative babinski.    Sensory UEs LEs    R L R L   Light touch WNL Decreased WNL Decreased   Pin prick WNL Decreased but can sharp from dull. WNL Decreased but can tell sharp from dull.   Vibration >10 seconds WNL WNL 10 sec 6 sec   Proprioception   WNL WNL      Cerebellar/Coordination:  Rapid alternating movements, finger-to-nose and heel-to-shin  bilaterally demonstrates no abnormalities.    Romberg was negative with eyes closed.    Gait: Normal stride, base and  armswing. Able to tandem, heel, and toe gait without difficulty.      MRI HISTORY;  03/16/2023 re- baseline MRI reports of Brain, Cervical , and Thoracic spine,  with / without contrast,  compared to 06/04/2022 which showed no new or enhancing lesions and no C/Tspine lesions.   06/04/2022 MRI reports and  mage of Brain,  with / without contrast,  compared to 11/28/2021 which showed no new or enhancing lesions.  Report in care everywhere for C/Tspine MRI's dated 11/28/21, 11/30/2021 which show normal spinal cord.    Latent TB:  -Chest xray, one view ,03/02/2022 negative.  -Started Rifampin 600 mg daily 02/2022.- 06/2022 or 07/2022, patient not sure.    REVIEW OF SYSTEMS:  A 10-systems review was performed and, unless otherwise noted, declared negative by patient.    Appointment on 06/07/2023   Component Date Value Ref Range Status    Albumin 06/07/2023 4.1  3.4 - 5.0 g/dL Final    Total Protein 06/07/2023 7.1  5.7 - 8.2 g/dL Final    Total Bilirubin 06/07/2023 0.4  0.3 - 1.2 mg/dL Final    Bilirubin, Direct 06/07/2023 0.10  0.00 - 0.30 mg/dL Final    AST 54/08/8118 21  <=34 U/L Final    ALT 06/07/2023 40  10 - 49 U/L Final    Alkaline Phosphatase 06/07/2023 84  46 - 116 U/L Final    WBC 06/07/2023 8.0  3.6 - 11.2 10*9/L Final    RBC 06/07/2023 4.65  4.26 - 5.60 10*12/L Final    HGB 06/07/2023 14.4  12.9 - 16.5 g/dL Final    HCT 14/78/2956 42.9  39.0 - 48.0 % Final    MCV 06/07/2023 92.3  77.6 - 95.7 fL Final    MCH 06/07/2023 31.1  25.9 - 32.4 pg Final    MCHC 06/07/2023 33.7  32.0 - 36.0 g/dL Final    RDW 21/30/8657 12.8  12.2 - 15.2 % Final    MPV 06/07/2023 8.9  6.8 - 10.7 fL Final    Platelet 06/07/2023 291  150 - 450 10*9/L Final    Neutrophils % 06/07/2023 66.7  % Final    Lymphocytes % 06/07/2023 24.6  % Final    Monocytes % 06/07/2023 6.6  % Final    Eosinophils % 06/07/2023 1.5  % Final    Basophils % 06/07/2023 0.6  % Final    Absolute  Neutrophils 06/07/2023 5.3  1.8 - 7.8 10*9/L Final    Absolute Lymphocytes 06/07/2023 2.0  1.1 - 3.6 10*9/L Final    Absolute Monocytes 06/07/2023 0.5  0.3 - 0.8 10*9/L Final    Absolute Eosinophils 06/07/2023 0.1  0.0 - 0.5 10*9/L Final    Absolute Basophils 06/07/2023 0.0  0.0 - 0.1 10*9/L Final       PROBLEM LIST:    Patient Active Problem List   Diagnosis    Multiple sclerosis (CMS-HCC)    Mild major depression (CMS-HCC)    History of Latent TB    Primary hypertension         CURRENT MEDICATIONS:    Current Outpatient Medications   Medication Sig Dispense Refill    amantadine HCL (SYMMETREL) 100 mg capsule Take 2 capsules (200 mg total) by mouth every morning. 180 capsule 1    cholecalciferol, vitamin D3-1,250 mcg, 50,000 unit,, 1,250 mcg (50,000 unit) capsule Take 1 capsule (1,250 mcg total) by mouth once a week. 24 capsule 0    dalfampridine 10 mg Tb12 Take 1 tablet (10 mg total) by mouth every twelve (12) hours. 180 tablet 1    DULoxetine (CYMBALTA) 30 MG capsule Take 2 capsules (60 mg total) by mouth daily. 180 capsule 1    losartan (COZAAR) 25 MG tablet Take 2 tablets (50 mg total) by mouth daily. 180 tablet 0    OCREVUS 30 mg/mL injection        No current facility-administered medications for this visit.       Past Surgical Hx:  No past surgical history on file.    Social Hx:    Social History     Socioeconomic History    Marital status: Married     Spouse name: None    Number of children: 5    Years of education: None    Highest education level: None   Occupational History    Occupation: Naval architect   Tobacco Use    Smoking status: Former     Current packs/day: 0.00     Average packs/day: 0.4 packs/day for 1.5 years (0.6 ttl pk-yrs)     Types: Cigarettes     Start date: 05/04/2020     Quit date: 11/02/2021     Years since quitting: 1.9    Smokeless tobacco: Never   Substance and Sexual Activity    Alcohol use: Not Currently    Drug use: Not Currently     Types: Marijuana    Sexual activity: Yes     Partners: Female     Social Determinants of Health     Financial Resource Strain: Low Risk  (01/30/2023)    Overall Financial Resource Strain (CARDIA)     Difficulty of Paying Living Expenses: Not hard at all   Food Insecurity: Food Insecurity Present (01/30/2023)    Hunger Vital Sign     Worried About Running Out of Food in the Last Year: Sometimes true     Ran Out of Food in the Last Year: Never true   Transportation Needs: Unmet Transportation Needs (01/30/2023)    PRAPARE - Therapist, art (Medical): Yes     Lack of Transportation (Non-Medical): Yes       Family Hx:    Family History   Problem Relation Age of Onset    Cancer Mother     Hypertension Sister     Aneurysm Cousin        ALLERGIES:  No Known Allergies

## 2023-10-29 DIAGNOSIS — G35 Multiple sclerosis: Principal | ICD-10-CM

## 2023-10-29 MED ORDER — DALFAMPRIDINE ER 10 MG TABLET,EXTENDED RELEASE,12 HR
ORAL_TABLET | Freq: Two times a day (BID) | ORAL | 1 refills | 90 days | Status: CN
Start: 2023-10-29 — End: ?

## 2023-10-29 NOTE — Unmapped (Signed)
Request received via interface.     Provider: Yolande Jolly, PA    Last Visit Date: 10/02/2023  Next Visit Date: 04/14/2024    Lab Results   Component Value Date    JC Virus AB POSITIVE 02/28/2022    Hep B Surface Ag Nonreactive 02/01/2022    Hep B Core Total Ab Nonreactive 02/01/2022    Hepatitis C Ab Nonreactive 02/01/2022    HIV Antigen/Antibody Combo Nonreactive 02/01/2022        No results found for this or any previous visit.      No results found for this or any previous visit.      No results found for this or any previous visit.

## 2023-10-29 NOTE — Unmapped (Signed)
[facility] Clinical Assessment & Refill Coordination Note    Nathan Yu [place], [place]: 1986/09/21  Phone: [phone] (home)     All above HIPAA information was verified with patient.     Was a Nurse, learning disability used for this call? No    Specialty Medication(s):   Neurology: Ampyra     Current Outpatient Medications   Medication Sig Dispense Refill    amantadine HCL (SYMMETREL) 100 mg capsule Take 2 capsules (200 mg total) by mouth two (2) times a day. 180 capsule 1    cholecalciferol, vitamin D3-1,250 mcg, 50,000 unit,, 1,250 mcg (50,000 unit) capsule Take 1 capsule (1,250 mcg total) by mouth once a week. 24 capsule 0    dalfampridine 10 mg Tb12 Take 1 tablet (10 mg total) by mouth every twelve (12) hours. 180 tablet 1    DULoxetine (CYMBALTA) 30 MG capsule Take 2 capsules (60 mg total) by mouth daily. 180 capsule 1    losartan (COZAAR) 25 MG tablet Take 2 tablets (50 mg total) by mouth daily. 180 tablet 0    OCREVUS 30 mg/mL injection        No current facility-administered medications for this visit.        Changes to medications: Nathan Yu reports no changes at this time.    No Known Allergies    Changes to allergies: No    SPECIALTY MEDICATION ADHERENCE     dalfampridine 10 mg: 0 days of medicine on hand     Medication Adherence    Patient reported X missed doses in the last month: 1  Specialty Medication: dalfampridine 10mg  BID  Patient is on additional specialty medications: No  Patient is on more than two specialty medications: No  Any gaps in refill history greater than 2 weeks in the last 3 months: no  Demonstrates understanding of importance of adherence: yes  Informant: patient          Specialty medication(s) dose(s) confirmed: Regimen is correct and unchanged.     Are there any concerns with adherence? No - took last dose of amprya yesterday and is now completely out of medicine.     Adherence counseling provided? Not needed - Prescription was mistakenly sent to local [facility] pharmacy instead of our pharmacy. I will call [facility] for a prescription transfer today.     CLINICAL MANAGEMENT AND INTERVENTION      Clinical Benefit Assessment:    Do you feel the medicine is effective or helping your condition?  Unsure    Clinical Benefit counseling provided?  Neurology note on 10/23 recommended holding Ampyra to see if he notices a difference off of this medicine. Mr. Zwahlen confirmed he has not held amypra and prefers to continue taking this medicine for now. He is unsure if  there is clinical benefit as he still experiences instability but does not want to stop in case symptoms worsen off of this medicine.     Adverse Effects Assessment:    Are you experiencing any side effects? No    Are you experiencing difficulty administering your medicine? No    Quality of Life Assessment:    Quality of Life    Rheumatology  Oncology  Dermatology  Cystic Fibrosis          How many days over the past month did your MS  keep you from your normal activities? For example, brushing your teeth or getting up in the morning. Patient declined to answer    Have you  discussed this with your provider? Not needed    Acute Infection Status:    Acute infections noted within Epic:  No active infections  Patient reported infection: None    Therapy Appropriateness:    Is therapy appropriate based on current medication list, adverse reactions, adherence, clinical benefit and progress toward achieving therapeutic goals? Yes, therapy is appropriate and should be continued     DISEASE/MEDICATION-SPECIFIC INFORMATION      N/A    Multiple Sclerosis: Have you experienced any flares in the last month? No  Has this been reported to your provider? Not applicable  What was the outcome of the flare? Not applicable    PATIENT SPECIFIC NEEDS     Does the patient have any physical, cognitive, or cultural barriers? No    Is the patient high risk? No    Did the patient require a clinical intervention? No    Does the patient require physician intervention or other additional services (i.e., nutrition, smoking cessation, social work)? No    SOCIAL DETERMINANTS OF HEALTH     At the [facility], we have learned that life circumstances - like trouble affording food, housing, utilities, or transportation can affect the health of many of our patients.   That is why we wanted to ask: are you currently experiencing any life circumstances that are negatively impacting your health and/or quality of life? Patient declined to answer    Social Drivers of Health     Food Insecurity: Food Insecurity Present (01/30/2023)    Hunger Vital Sign     Worried About Running Out of Food in the Last Year: Sometimes true     Ran Out of Food in the Last Year: Never true   Internet Connectivity: Not on file   Housing/Utilities: Low Risk  (01/30/2023)    Housing/Utilities     Within the past 12 months, have you ever stayed: outside, in a car, in a tent, in an overnight shelter, or temporarily in someone else's home (i.e. couch-surfing)?: No     Are you worried about losing your housing?: No     Within the past 12 months, have you been unable to get utilities (heat, electricity) when it was really needed?: No   Tobacco Use: Medium Risk (10/02/2023)    Patient History     Smoking Tobacco Use: Former     Smokeless Tobacco Use: Never     Passive Exposure: Not on file   Transportation Needs: Unmet Transportation Needs (01/30/2023)    PRAPARE - Therapist, art (Medical): Yes     Lack of Transportation (Non-Medical): Yes   Alcohol Use: Unknown (01/20/2019)    Received from [facility], [facility]    AUDIT-C     Frequency of Alcohol Consumption: 2-4 times a month     Average Number of Drinks: 1 or 2     Frequency of Binge Drinking: Not on file   Interpersonal Safety: Not on file   Physical Activity: Not on file   Intimate Partner Violence: Not on file   Stress: Not on file   Substance Use: Not on file (10/20/2023)   Social Connections: Not on file   Financial Resource Strain: Low Risk  (01/30/2023)    Overall Financial Resource Strain (CARDIA)     Difficulty of Paying Living Expenses: Not hard at all   Depression: At risk (12/06/2021)    PHQ-2     PHQ-2 Score: 5  Health Literacy: Not on file       Would you be willing to receive help with any of the needs that you have identified today? Not applicable       SHIPPING     Specialty Medication(s) to be Shipped:   Neurology: Dalfampridine    Other medication(s) to be shipped: No additional medications requested for fill at this time     Changes to insurance: No    Delivery Scheduled: Yes, Expected medication delivery date: 10/30/23.  However, Rx request for refills was sent to the provider as there are none remaining.     Medication will be delivered via Same Day Courier to the confirmed prescription address in [facility].    The patient will receive a drug information handout for each medication shipped and additional FDA Medication Guides as required.  Verified that patient has previously received a Conservation officer, historic buildings and a Surveyor, mining.    The patient or caregiver noted above participated in the development of this care plan and knows that they can request review of or adjustments to the care plan at any time.      All of the patient's questions and concerns have been addressed.    Oliva Bustard, PharmD   [facility] Special[facility] and Home Delivery Pharmacy Specialty Pharmacist

## 2023-11-21 NOTE — Unmapped (Signed)
[facility] Home Delivery Pharmacy Refill Coordination Note    Specialty Medication(s) to be Shipped:   Neurology: Dalfampridine    Other medication(s) to be shipped: No additional medications requested for fill at this time     Nathan Yu, DOB: 02-Jul-1986  Phone: [phone] (home)       All above HIPAA information was verified with patient.     Was a Nurse, learning disability used for this call? No    Completed refill call assessment today to schedule patient's medication shipment from the [facility]  [phone]).  All relevant notes have been reviewed.     Specialty medication(s) and dose(s) confirmed: Regimen is correct and unchanged.   Changes to medications: [place] reports no changes at this time.  Changes to insurance: No  New side effects reported not previously addressed with a pharmacist or physician: None reported  Questions for the pharmacist: No    Confirmed patient received a Conservation officer, historic buildings and a Surveyor, mining with first shipment. The patient will receive a drug information handout for each medication shipped and additional FDA Medication Guides as required.       DISEASE/MEDICATION-SPECIFIC INFORMATION        N/A    SPECIALTY MEDICATION ADHERENCE     Medication Adherence    Patient reported X missed doses in the last month: 0  Specialty Medication: dalfampridine 10 mg Tb12  Patient is on additional specialty medications: No              Were doses missed due to medication being on hold? No      dalfampridine 10 mg Tb12: 6 doses of medicine on hand       REFERRAL TO PHARMACIST     Referral to the pharmacist: Not needed      [facility]     Shipping address confirmed in Epic.       Delivery Scheduled: Yes, Expected medication delivery date: 11/26/2023.     Medication will be delivered via Same Day Courier to the prescription address in Epic WAM.    Dominick [facility]lla [facility]  Specialty Technician

## 2023-11-26 MED FILL — DALFAMPRIDINE ER 10 MG TABLET,EXTENDED RELEASE,12 HR: ORAL | 30 days supply | Qty: 60 | Fill #1

## 2023-12-30 NOTE — Unmapped (Signed)
[facility] Home Delivery Pharm[facility] Refill Coordination Note    Nathan Yu, [place]: 1986-06-11  Phone: [phone] (home)       All above HIPAA information was verified with patient.         12/27/2023    12:47 PM   Specialty Rx Medication Refill Questionnaire   Which Medications would you like refilled and shipped? Dalfampridine   Please list all current allergies: N/A   Have you missed any doses in the last 30 days? No   Have you had any changes to your medication(s) since your last refill? No   How many days remaining of each medication do you have at home? 1 day   Have you experienced any side effects in the last 30 days? No   Please enter the full address (street address, city, state, zip code) where you would like your medication(s) to be delivered to. [address] , [place], [address]   Please specify on which day you would like your medication(s) to arrive. Note: if you need your medication(s) within 3 days, please call the pharmacy to schedule your order at [phone]  12/30/2023   Has your insurance changed since your last refill? No   Would you like a pharmacist to call you to discuss your medication(s)? No   Do you require a signature for your package? (Note: if we are billing Medicare Part B or your order contains a controlled substance, we will require a signature) No         Completed refill call assessment today to schedule patient's medication shipment from the [facility] and Home Delivery Pharmacy [phone]).  All relevant notes have been reviewed.       Confirmed patient received a Conservation officer, historic buildings and a Surveyor, mining with first shipment. The patient will receive a drug information handout for each medication shipped and additional FDA Medication Guides as required.         REFERRAL TO PHARMACIST     Referral to the pharmacist: Not needed      [facility]     Shipping address confirmed in Epic.     Delivery Scheduled: Yes, Expected medication delivery date: 12/31/2023.     Medication will be delivered via Same Day Courier to the prescription address in Epic WAM.    [name]   [facility] Special[facility] and Home Delivery Pharmacy Specialty Technician

## 2023-12-31 MED FILL — DALFAMPRIDINE ER 10 MG TABLET,EXTENDED RELEASE,12 HR: ORAL | 30 days supply | Qty: 60 | Fill #2

## 2024-01-28 NOTE — Unmapped (Deleted)
 [facility] Specialty and Home Delivery Pharmacy Refill Coordination Note    Nathan Yu, [name]: May 07, 1986  Phone: [phone] (home)       All above HIPAA information was verified with patient.         01/27/2024     2:56 PM   Specialty Rx Medication Refill Questionnaire   Which Medications would you like refilled and shipped? dalfampridine 10 mg   Please list all current allergies: N/A   Have you missed any doses in the last 30 days? No   Have you had any changes to your medication(s) since your last refill? No   How many days remaining of each medication do you have at home? 3   Have you experienced any side effects in the last 30 days? No   Please enter the full address (street address, city, state, zip code) where you would like your medication(s) to be delivered to. [name] [address]   Please specify on which day you would like your medication(s) to arrive. Note: if you need your medication(s) within 3 days, please call the pharmacy to schedule your order at [phone]  01/29/2024   Has your insurance changed since your last refill? No   Would you like a pharmacist to call you to discuss your medication(s)? No   Do you require a signature for your package? (Note: if we are billing Medicare Part B or your order contains a controlled substance, we will require a signature) No         Completed refill call assessment today to schedule patient's medication shipment from the [facility] Delivery Pharm[facility] [phone]).  All relevant notes have been reviewed.       Confirmed patient received a Conservation officer, historic buildings and a Surveyor, mining with first shipment. The patient will receive a drug information handout for each medication shipped and additional FDA Medication Guides as required.         REFERRAL TO PHARMACIST     Referral to the pharmacist: Not needed      [facility]     Shipping address confirmed in Epic.     Delivery Scheduled: Yes, Expected medication delivery date: 01/30/24.     Medication will be delivered via Same Day Courier to the prescription address in Epic WAM.    Nathan Yu   [facility] Specialty and Home Delivery Pharmacy Specialty Technician

## 2024-01-29 NOTE — Unmapped (Signed)
 [facility] Special[facility] and Home Delivery Pharmacy Refill Coordination Note    Nathan Yu [place], [place]: [birth year]  Phone: [phone] (home)       All above HIPAA information was verified with patient.         01/27/2024     2:56 PM   Specialty Rx Medication Refill Questionnaire   Which Medications would you like refilled and shipped? dalfampridine 10 mg   Please list all current allergies: N/A   Have you missed any doses in the last 30 days? No   Have you had any changes to your medication(s) since your last refill? No   How many days remaining of each medication do you have at home? 3   Have you experienced any side effects in the last 30 days? No   Please enter the full address (street address, city, state, zip code) where you would like your medication(s) to be delivered to. [address] [place] [address]   Please specify on which day you would like your medication(s) to arrive. Note: if you need your medication(s) within 3 days, please call the pharmacy to schedule your order at [phone]  01/29/2024   Has your insurance changed since your last refill? No   Would you like a pharmacist to call you to discuss your medication(s)? No   Do you require a signature for your package? (Note: if we are billing Medicare Part B or your order contains a controlled substance, we will require a signature) No         Completed refill call assessment today to schedule patient's medication shipment from the [facility] and [facility] Delivery Pharmacy [phone]).  All relevant notes have been reviewed.       Confirmed patient received a Conservation officer, historic buildings and a Surveyor, mining with first shipment. The patient will receive a drug information handout for each medication shipped and additional FDA Medication Guides as required.         REFERRAL TO PHARMACIST     Referral to the pharmacist: Not needed      [facility]     Shipping address confirmed in Epic.     02/19 Communicated delivery date change -JJ       Delivery Scheduled: Yes, Expected medication delivery date: 01/30/24.     Medication will be delivered via Same Day Courier to the prescription address in Epic WAM.    Dan Europe   [facility] Special[facility] and Home Delivery Pharmacy Specialty Technician

## 2024-01-30 MED FILL — DALFAMPRIDINE ER 10 MG TABLET,EXTENDED RELEASE,12 HR: ORAL | 30 days supply | Qty: 60 | Fill #3

## 2024-01-30 NOTE — Unmapped (Signed)
 Nathan Yu 's Dalfampridine shipment will be rescheduled as a result of inclement weather.     I have spoken with the patient  at [phone]  and communicated the delivery change. We will reschedule the medication for the delivery date that the patient agreed upon.  We have confirmed the delivery date as 01/31/24, via same day courier.

## 2024-02-28 NOTE — Unmapped (Signed)
 [facility] Specialty and Home Delivery Pharmacy Refill Coordination Note    Nathan Yu [place], [place]: 1986-05-10  Phone: [phone] (home)       All above HIPAA information was verified with patient.         02/28/2024     6:01 AM   Specialty Rx Medication Refill Questionnaire   Which Medications would you like refilled and shipped? dalfampridine 10 mg Tb12.   If medication refills are not needed at this time, please indicate the reason below. I have 2 days left   Please list all current allergies: N/A   Have you missed any doses in the last 30 days? No   Have you had any changes to your medication(s) since your last refill? No   How many days remaining of each medication do you have at home? 2   Have you experienced any side effects in the last 30 days? No   Please enter the full address (street address, city, state, zip code) where you would like your medication(s) to be delivered to. [address], [place] [address]   Please specify on which day you would like your medication(s) to arrive. Note: if you need your medication(s) within 3 days, please call the pharmacy to schedule your order at [phone]  03/02/2024   Has your insurance changed since your last refill? No   Would you like a pharmacist to call you to discuss your medication(s)? No   Do you require a signature for your package? (Note: if we are billing Medicare Part B or your order contains a controlled substance, we will require a signature) No   I have been provided my out of pocket cost for my medication and approve the pharmacy to charge the amount to my credit card on file. Yes         Completed refill call assessment today to schedule patient's medication shipment from the [facility] and Home Delivery Pharmacy [phone]).  All relevant notes have been reviewed.       Confirmed patient received a Conservation officer, historic buildings and a Surveyor, mining with first shipment. The patient will receive a drug information handout for each medication shipped and additional FDA Medication Guides as required.         REFERRAL TO PHARMACIST     Referral to the pharmacist: Not needed      [facility]     Shipping address confirmed in Epic.     Delivery Scheduled: Yes, Expected medication delivery date: 03/02/24.     Medication will be delivered via Same Day Courier to the prescription address in Epic WAM.    Dan Europe   [facility] Specialty and Home Delivery Pharmacy Specialty Technician

## 2024-03-02 MED FILL — DALFAMPRIDINE ER 10 MG TABLET,EXTENDED RELEASE,12 HR: ORAL | 30 days supply | Qty: 60 | Fill #4

## 2024-04-07 ENCOUNTER — Inpatient Hospital Stay: Admit: 2024-04-07 | Discharge: 2024-04-08 | Payer: Medicaid (Managed Care)

## 2024-04-07 NOTE — Unmapped (Signed)
 Images request and imported into patient chart from [facility] CT Head MR Head Cervical MR Orbits 11/28/2021 and Thoracic Spine 11/30/2021 .

## 2024-04-08 NOTE — Unmapped (Signed)
 The [facility] has made a second and final attempt to reach this patient to refill the following medication:dalfampridine  10 mg Tb12.      We have left voicemails on the following phone numbers: [phone] and [phone], have sent a MyChart message, have sent a text message to the following phone numbers: [phone], and have sent a Mychart questionnaire..    Dates contacted: 03/25/2024 and 04/08/2024  Last scheduled delivery: 03/02/2024    The patient may be at risk of non-compliance with this medication. The patient should call the [facility] at [phone]  Option 4, then Option 4: Infectious Disease, Transplant to refill medication.    Lanny Plan   [facility] Specialty and Home Delivery Oncologist

## 2024-04-13 NOTE — Unmapped (Addendum)
 [employer]  School of Medicine at [facility]  Multiple Sclerosis/Neuroimmunology Division  Dr. Jannice Mends  MS/Neuroimmunology Fellow    DATE OF VISIT: 04/14/2024    Re:  Nathan Yu  [address]  [place] [address]  MRN: [id number]  DOB: July 04, 1986        Visit: FU visit  Last seen by Nathan Harsh, PA-C on 10/02/2023       REASON FOR VISIT: Nathan Yu, a 38 y.o. 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                                                                                                                     ?? 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 In early 2022, had fluctuating paroxsymal episodes and symptoms of gait instability, pain in the left arm, worsening diplopia, and weakness in the legs. Described as intermittent over the course of 1 year. In December 2022, presented hospital and received 5 days of IVMP with some improvement.     - MS diagnosis date: March 2023, diagnosis confirmed after evaluation of mimicking diseases  Meets 2017 McDonald's criteria for DIT with (1) enhancing and non enhancing lesions in typical CNS demyelinating location from review of 11/28/2021 imaging   DIS: periventricular and infratentorial (brainstem/cerebellar peduncle) T2/FLAIR hyperintensities. No spinal cord involvement.     - Disease course at onset: RRMS  - Current disease course: RRMS    - Last MS relapse date: no overt clinical relapse necessitating steroids since 11/2021  - Last steroid treatment: 11/2021    - MS DMD History:  Rebif  05/09/2022 - 07/2022. Started with this medication due to quantiferon TB gold+ and 3 months of Rifampin treatment   Ocrevus  induction doses administered on 08/09/2022 and 09/13/2022.     - MS symptomatic treatment: Cymbalta  60 mg daily     Plan:   Continue Ocrevus  IV 600 mg q6 months. Next infusion is scheduled for 09/18/24 and he should keep this appointment.   Labs today: CBC with diff, IgG, IgM, CD19%, and vitamin D level   Wean off Amantadine  as follows: 100 mg BID x 7 days >> 100 mg daily x 7 days >> 100 mg every other day >> off.   Will trial Provigil for patient's fatigue related to MS. Start at 100 mg and increase to 200 mg daily as tolerated. Recommended patient to not take any doses after 12 pm to avoid sleep disturbance.  Continue Dalfampridine  10 mg BID to assist with walking speed.  Obtain NeuroQuant MRI brain.   Advised patient to reapply to charity care or attempt to get on his wife's health insurance. He will also look into applying for ObamaCare. Will await his charity care application before sending referral to Neuro-ophthalmology.   Follow up with Dr. Simeon Duane or Dr. 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     INTERVAL history since last visit with Nathan Harsh, PA-C on 10/02/2023:  - Having a lot of fatigue for the past 3 weeks; example - going to the bathroom just makes him feel winded. He thought at first it had to do with the Ocrevus  infusion itself. He definitely felt fatigue before early on in the course of his disease, but this is more pronounced.   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 States since he is only 87 he may want to work again - he will think about career options   - Sometimes tells his body to do things but his body doesn't respond (Ex- trying to grab a spoon out of the drawer and takes a while, even if his brain is actively telling his arm to move)  - Does not do much cardio exercise. They have workouts through the [employer] website and he does some of these calisthenics, etc. If walking to the store, he can feel like he is limping after a short distance  - Early on, speech therapy helped still has a lot of residual word finding difficulty but overall this has improved (was done over telehealth visits)      Current symptoms (chief complaints):  Vision/double vision: when looking to the left and right on horizontal end gaze, worse when looking to the left   Speech, swallowing problems: denies   Fatigue: has felt it before, now more pronounced (ongoing since the last infusion)  Tingling/numbness/pain: Cymbalta  helps and typically does not wake him up in the middle of the night. More pronounced in the mornings.   Balance/coordination problems: has to think more about walking so that he keeps his stability, can randomly feel his R knee give out or a jolt of neuropathic pain   Bowel/bladder control problems:  Memory, mood: having a lot of issues forgetting simple things, word finding difficulty   Gait: does not necessarily feel stable on his feet but not tripping as he once was   Falls: denies recent falls       Neurologic History:  - Patient was first seen in resident clinic and then evaluated by Dr. Aldona Amel as a new patient on 02/01/2022  - Delayed start to DMT due to TB test coming back positive, required 3 months of treatment with Rifampin - was on Rebif    - Started on Ocrevus  in August 2023        ............................................................................................................................................Nathan Yu  DIAGNOSTIC STUDIES / REVIEW OF RECORDS:    Prior medical records: reviewed personally, please see assessment    03/16/2023 MRI brain: Multiple foci of T2/FLAIR hyperintensity in the periventricular white matter as well as posterior fossa which appear unchanged. Some  of the lesions demonstrate corresponding T1 black holes consistent with axonal loss. No enhancing lesions. The optic nerves are normal in appearance. No diffuse brain volume loss.    10/18/2022 Neuro ophthalmology visit:   - HVF OU: OD inferior scotoma, superior temporal scotoma; OS WNL  - OCT RNFL OU: OD 106; OS 111  - OCT GCC OU: WNL  .............................................................................................................................................      Past Medical History:   Diagnosis Date    Multiple sclerosis 12/06/2021       No Known Allergies        Current Outpatient Medications   Medication Sig Dispense Refill    cholecalciferol , vitamin D3-1,250 mcg, 50,000 unit,, 1,250 mcg (50,000 unit) capsule Take 1 capsule (1,250 mcg total) by mouth once a week. 24 capsule 0    dalfampridine  10 mg Tb12 Take 1 tablet (10 mg total) by mouth every twelve (12) hours. 180 tablet 1    DULoxetine  (CYMBALTA ) 30 MG capsule Take 2 capsules (60 mg total) by mouth daily. 180 capsule 1    OCREVUS  30 mg/mL injection       modafinil (PROVIGIL) 100 MG tablet Take 1 tablet (100 mg total) by mouth daily for 7 days, THEN 2 tablets (200 mg total) daily. Do not take after 12 pm. 67 tablet 0     No current facility-administered medications for this visit.       No past surgical history on file.      Social History     Socioeconomic History    Marital status: Married     Spouse name: None    Number of children: 5    Years of education: None    Highest education level: None   Occupational History Occupation: Naval architect   Tobacco Use    Smoking status: Former     Current packs/day: 0.00     Average packs/day: 0.4 packs/day for 1.5 years (0.6 ttl pk-yrs)     Types: Cigarettes     Start date: 05/04/2020     Quit date: 11/02/2021     Years since quitting: 2.4     Passive exposure: Never    Smokeless tobacco: Never   Substance and Sexual Activity    Alcohol use: Not Currently    Drug use: Not Currently     Types: Marijuana    Sexual activity: Yes     Partners: Female     Social Drivers of Health     Financial Resource Strain: Low Risk  (01/30/2023)    Overall Financial Resource Strain (CARDIA)     Difficulty of Paying Living Expenses: Not hard at all   Food Insecurity: Food Insecurity Present (01/30/2023)    Hunger Vital Sign     Worried About Running Out of Food in the Last Year: Sometimes true     Ran Out of Food in the Last Year: Never true   Transportation Needs: Unmet Transportation Needs (01/30/2023)    PRAPARE - Therapist, art (Medical): Yes     Lack of Transportation (Non-Medical): Yes       Family History   Problem Relation Age of Onset    Cancer Mother     Hypertension Sister     Aneurysm Cousin         Review of Systems:  Otherwise negative unless stated in HPI/assessment     Objective:     Physical Exam:  Blood pressure 161/103, pulse 67, temperature 36.9 ??C (98.4 ??F), temperature source  Temporal, height 167.6 cm (5' 6), weight (!) 110.2 kg (243 lb).   General Appearance: well appearing. Normal skin color, afebrile.     NEUROLOGICAL EXAMINATION:     General:  Alert and oriented to self  Speech is fluent. No dysarthria.   No obvious word finding difficulty during interview.     PHQ9 score: 12 (3 tired/energy, 2's for moving/speaking slowly, sleep, feeling down/depressed, 1 for anhedonia).     Cranial Nerves:   II, III- Visual Acuity: R 20/32 -1, L 20/32. Glasses: yes. No visual field defect.   III, IV, VI- slow saccades, left end gaze horizontal nystagmus and binocular diplopia; right partial INO.   V- sensation of the face intact b/l.   VII- face symmetrical, no facial droop, buries lashes and puffs out cheeks against resistance   VIII- Hearing grossly intact.   IX and X- symmetric palate contraction, uvula is midline  XI- Full shoulder shrug bilaterally  XII- tongue protrudes midline      Motor Exam:   No spasticity     Muscles UEs  LEs    R L  R L   Deltoids 5/5 5/5 Hip flexors  5/5 5/5   Biceps 5/5 5/5 Hip extensors 5/5 5/5   Triceps 5/5 5/5 Knee flexors 5/5 5/5   Hand grip 5/5 5/5      Wrist flexors  5/5 5/5 Foot dorsal flexors 5/5 5/5   Wrist extensors 5/5 5-/5 Foot plantar flexors 5/5 5/5        Reflexes R L   Biceps +2 +2   Brachioradialis  +2 +2   Patella +2 +2       Sensory system:  Superficial light touch sensation: WNL     (WNL= within normal limits; UE= upper extremities; LE= lower extremities;       R= right, L= left).    Cerebellar/Coordination:  Normal finger to nose with no end point dysmetria or dystaxia b/l  Normal heel to shin b/l.     Gait: Normal casual gait       T25FW  04/14/24 unassisted walk: 6.38 seconds  04/02/23 unassisted walk: 4.66 seconds   .........................................................................................................................................Nathan Yu    VISIT SUMMARY:  Shaheem Folley, a 38 y.o. 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 All questions were answered.     I personally spent 60 minutes face-to-face and non-face-to-face in the care of this patient, which includes all pre, intra, and post visit time on the date of service.  All documented time was specific to the E/M visit and does not include any procedures that may have been performed.    I took a detailed history of the present illness from Mr. Bornhorst. Relevant details on past medical history, family history, and social history were reviewed.  I personally reviewed  patient's prior medical records, radiology reports, and laboratory work.    I personally reviewed the patient???s prior MRI images and have discussed  MRI findings with the patient.   I performed medication reconciliation and neurological examination   Patient completed the PHQ-9 depression questionnaire and T25FW  The diagnosis and the plan was discussed with the patient who was amenable to the recommendations as detailed above.     Case was discussed with attending physician, Dr. Aldona Amel     Thank you for the opportunity to contribute to the care of Mr. Treyven Lopezramirez [facility].

## 2024-04-14 ENCOUNTER — Ambulatory Visit
Admit: 2024-04-14 | Discharge: 2024-04-14 | Attending: Student in an Organized Health Care Education/Training Program | Primary: Student in an Organized Health Care Education/Training Program

## 2024-04-14 ENCOUNTER — Ambulatory Visit: Admit: 2024-04-14 | Discharge: 2024-04-14

## 2024-04-14 DIAGNOSIS — G35 Multiple sclerosis: Principal | ICD-10-CM

## 2024-04-14 DIAGNOSIS — R5383 Other fatigue: Principal | ICD-10-CM

## 2024-04-14 DIAGNOSIS — H5121 Internuclear ophthalmoplegia, right eye: Principal | ICD-10-CM

## 2024-04-14 LAB — CBC W/ AUTO DIFF
BASOPHILS ABSOLUTE COUNT: 0 10*9/L (ref 0.0–0.1)
BASOPHILS RELATIVE PERCENT: 0.5 %
EOSINOPHILS ABSOLUTE COUNT: 0.1 10*9/L (ref 0.0–0.5)
EOSINOPHILS RELATIVE PERCENT: 1.3 %
HEMATOCRIT: 43.9 % (ref 39.0–48.0)
HEMOGLOBIN: 14.7 g/dL (ref 12.9–16.5)
LYMPHOCYTES ABSOLUTE COUNT: 2.4 10*9/L (ref 1.1–3.6)
LYMPHOCYTES RELATIVE PERCENT: 29.8 %
MEAN CORPUSCULAR HEMOGLOBIN CONC: 33.6 g/dL (ref 32.0–36.0)
MEAN CORPUSCULAR HEMOGLOBIN: 31.5 pg (ref 25.9–32.4)
MEAN CORPUSCULAR VOLUME: 93.7 fL (ref 77.6–95.7)
MEAN PLATELET VOLUME: 8.8 fL (ref 6.8–10.7)
MONOCYTES ABSOLUTE COUNT: 0.8 10*9/L (ref 0.3–0.8)
MONOCYTES RELATIVE PERCENT: 9.6 %
NEUTROPHILS ABSOLUTE COUNT: 4.7 10*9/L (ref 1.8–7.8)
NEUTROPHILS RELATIVE PERCENT: 58.8 %
PLATELET COUNT: 345 10*9/L (ref 150–450)
RED BLOOD CELL COUNT: 4.68 10*12/L (ref 4.26–5.60)
RED CELL DISTRIBUTION WIDTH: 13.3 % (ref 12.2–15.2)
WBC ADJUSTED: 8.1 10*9/L (ref 3.6–11.2)

## 2024-04-14 LAB — RITUXAN CD20, FLOW
ABSOLUTE CD16/56 CNT: 672 {cells}/uL (ref 15–1080)
ABSOLUTE CD3 CNT: 1728 {cells}/uL (ref 915–3400)
CD16/56%NK CELL: 28 % — ABNORMAL HIGH (ref 1–27)
CD19% (B CELLS): <1 % — ABNORMAL LOW (ref 7–23)
CD3% (T CELLS): 72 % (ref 61–86)
RITUXIN CD45%: 100

## 2024-04-14 LAB — IGA: GAMMAGLOBULIN; IGA: 306.8 mg/dL (ref 70.0–400.0)

## 2024-04-14 LAB — IGG: GAMMAGLOBULIN; IGG: 1054 mg/dL (ref 650–1600)

## 2024-04-14 LAB — IGM: GAMMAGLOBULIN; IGM: 35 mg/dL — ABNORMAL LOW (ref 40–230)

## 2024-04-14 MED ORDER — MODAFINIL 100 MG TABLET
ORAL_TABLET | ORAL | 0 refills | 37.00000 days | Status: CP
Start: 2024-04-14 — End: 2024-05-21

## 2024-04-14 NOTE — Unmapped (Addendum)
 Request fax to [facility] for CBC IgG lab result per request. Please see fax confirmation under media tab thanks          ----- Message from Jannice Mends, MD sent at 04/13/2024  7:20 PM EDT -----  Regarding: labs from last infusion  Hi Rydge Texidor,   Do you mind requesting the blood work that was done at [facility] during patient's last Ocrevus  infusion (03/19/24). I don't see it uploaded to media - I might have missed it?     Thanks!  Richa

## 2024-04-14 NOTE — Unmapped (Signed)
 Return follow up call to [facility] who states they did not get lab result yet. Number was given to [facility] . Call [facility] spoke to a Ms Brian Campanile who states will fax lab results that was perform on 03/19/2024. Thanks

## 2024-04-14 NOTE — Unmapped (Addendum)
 Thank you for visiting the [facility] MS/Neuroimmunology Clinic today!  Please see below our contact information:      In case of:  a suspected relapse (new symptoms or worsening existing symptoms, lasting for >24h)  OR  a need for an additional appointment for other reasons  OR   if you have other questions: please contact:      [facility] Neurology [facility] Desk  Phone: [phone]     If you have questions for our clinical pharmacist Ronette Coffer, please call: [phone]    If you would have questions outside regular office hours, please call [facility] hospital operator:   Phone: [phone], and ask for a neurology resident on-call.               Dr. Jannice Mends  MS/Neuroimmunology Fellow  [facility] of Medicine, Department of Neurology  Multiple Sclerosis/Neuroimmunology Division    The [facility] MS/Neuroimmunology Clinic is a specialty clinic and there is a need for you to have a  primary care provider  who will take care of your non-neurological health.   ....................................................................................................    Our recommendations from today's visit:   Labs today   Re-apply to charity care and go to healthcare.gov to get basic health insurance  Slowly wean down on the Amantadine   We will try Provigil for your fatigue. We will start slow and go to 100 mg twice a day x 7 days >> 100 mg daily x 7 days then off  Schedule your MRI   Follow up with Dr. Simeon Duane or Dr. Irving Mantle in  6 - 8 months   ........................................................................................Aaron Aas    It was a pleasure to see you today!    We are grateful for the opportunity to contribute to your care and are looking forward to seeing you again.  Please if you can take time to complete a post-visit survey so we can identify opportunities for improvement. We also like to know when we do things well so we can continue providing great service.

## 2024-04-17 LAB — VITAMIN D 25 HYDROXY: VITAMIN D, TOTAL (25OH): 19.5 ng/mL — ABNORMAL LOW (ref 20.0–80.0)

## 2024-04-20 ENCOUNTER — Ambulatory Visit: Admit: 2024-04-20

## 2024-04-29 MED ORDER — AMANTADINE HCL 100 MG CAPSULE
ORAL_CAPSULE | 1 refills | 0.00000 days
Start: 2024-04-29 — End: ?

## 2024-04-29 NOTE — Unmapped (Signed)
 Last Visit Date: 04/14/2024  Next Visit Date: 10/16/2024  Last seen by Dr Zelda Hickman   Lab Results   Component Value Date    JC Virus AB POSITIVE 02/28/2022    Hep B Surface Ag Nonreactive 02/01/2022    Hep B Core Total Ab Nonreactive 02/01/2022    Hepatitis C Ab Nonreactive 02/01/2022    HIV Antigen/Antibody Combo Nonreactive 02/01/2022        No results found for this or any previous visit.      No results found for this or any previous visit.      No results found for this or any previous visit.

## 2024-04-29 NOTE — Unmapped (Signed)
 No longer taking this medication

## 2024-05-05 NOTE — Unmapped (Signed)
 [facility] Specialty and Home Delivery Pharmacy Refill Coordination Note    Specialty Medication(s) to be Shipped:   Neurology: Dalfampridine     Other medication(s) to be shipped: No additional medications requested for fill at this time     Nathan Yu, DOB: 06-03-1986  Phone: [phone] (home)       All above HIPAA information was verified with patient.     Was a Nurse, learning disability used for this call? No    Completed refill call assessment today to schedule patient's medication shipment from the [facility] Delivery [facility]  [phone]).  All relevant notes have been reviewed.     Specialty medication(s) and dose(s) confirmed: Regimen is correct and unchanged.   Changes to medications: Nathan Yu reports no changes at this time.  Changes to insurance: No  New side effects reported not previously addressed with a pharmacist or physician: None reported  Questions for the pharmacist: No    Confirmed patient received a Conservation officer, historic buildings and a Surveyor, mining with first shipment. The patient will receive a drug information handout for each medication shipped and additional FDA Medication Guides as required.       DISEASE/MEDICATION-SPECIFIC INFORMATION        N/A    SPECIALTY MEDICATION ADHERENCE     Medication Adherence    Patient reported X missed doses in the last month: 1-2  Specialty Medication: dalfampridine  10 mg Tb12  Patient is on additional specialty medications: No              Were doses missed due to medication being on hold? No    Dalfampridine  10 mg: 0 days of medicine on hand        REFERRAL TO PHARMACIST     Referral to the pharmacist: Not needed      [facility]     Shipping address confirmed in Epic.     Cost and Payment: Patient has a copay of $4. They are aware and have authorized the pharmacy to charge the credit card on file.    Delivery Scheduled: Yes, Expected medication delivery date: 05/06/24.     Medication will be delivered via Same Day Courier to the prescription address in Epic [place].    Canary Ceo   [facility] Specialty and Home Delivery Pharmacy  Specialty Technician

## 2024-05-06 DIAGNOSIS — R5383 Other fatigue: Principal | ICD-10-CM

## 2024-05-06 MED ORDER — PROVIGIL 100 MG TABLET
ORAL_TABLET | ORAL | 0 refills | 37.00000 days | Status: CP
Start: 2024-05-06 — End: 2024-06-12

## 2024-05-06 MED FILL — DALFAMPRIDINE ER 10 MG TABLET,EXTENDED RELEASE,12 HR: ORAL | 30 days supply | Qty: 60 | Fill #5

## 2024-05-06 NOTE — Unmapped (Signed)
 Addended byJannice Mends on: 05/06/2024 01:35 PM     Modules accepted: Orders

## 2024-05-27 DIAGNOSIS — G35 Multiple sclerosis: Principal | ICD-10-CM

## 2024-05-27 MED ORDER — DALFAMPRIDINE ER 10 MG TABLET,EXTENDED RELEASE,12 HR
ORAL_TABLET | Freq: Two times a day (BID) | ORAL | 0 refills | 90.00000 days | Status: CP
Start: 2024-05-27 — End: ?
  Filled 2024-06-02: qty 180, 90d supply, fill #0

## 2024-05-27 NOTE — Unmapped (Signed)
 Request received via interface.     Provider: Dr. Zelda Hickman    Last Visit Date: 04/14/2024  Next Visit Date: 10/16/2024    Lab Results   Component Value Date    JC Virus AB POSITIVE 02/28/2022    Hep B Surface Ag Nonreactive 02/01/2022    Hep B Core Total Ab Nonreactive 02/01/2022    Hepatitis C Ab Nonreactive 02/01/2022    HIV Antigen/Antibody Combo Nonreactive 02/01/2022        No results found for this or any previous visit.      No results found for this or any previous visit.      No results found for this or any previous visit.

## 2024-05-29 NOTE — Unmapped (Signed)
 [facility] Specialty and Home Delivery Pharmacy Refill Coordination Note    Nathan Yu, DOB: 1986-03-29  Phone: [phone] (home)       All above HIPAA information was verified with patient.         05/29/2024    11:32 AM   Specialty Rx Medication Refill Questionnaire   Which Medications would you like refilled and shipped? dalfampridine  10 mg Tb12   Please list all current allergies: N/A   Have you missed any doses in the last 30 days? No   Have you had any changes to your medication(s) since your last refill? No   How many days remaining of each medication do you have at home? 2 days   Have you experienced any side effects in the last 30 days? No   Please enter the full address (street address, city, state, zip code) where you would like your medication(s) to be delivered to. [address] , [place] [address]   Please specify on which day you would like your medication(s) to arrive. Note: if you need your medication(s) within 3 days, please call the pharmacy to schedule your order at [phone]  06/02/2024   Has your insurance changed since your last refill? No   Would you like a pharmacist to call you to discuss your medication(s)? No   Do you require a signature for your package? (Note: if we are billing Medicare Part B or your order contains a controlled substance, we will require a signature) No   I have been provided my out of pocket cost for my medication and approve the pharmacy to charge the amount to my credit card on file. Yes         Completed refill call assessment today to schedule patient's medication shipment from the [facility] Delivery Pharm[facility] [phone]).  All relevant notes have been reviewed.       Confirmed patient received a Conservation officer, historic buildings and a Surveyor, mining with first shipment. The patient will receive a drug information handout for each medication shipped and additional FDA Medication Guides as required.         REFERRAL TO PHARMACIST Referral to the pharmacist: Not needed      [facility]     Shipping address confirmed in Epic.     Delivery Scheduled: Yes, Expected medication delivery date: 06/02/2024.     Medication will be delivered via Same Day Courier to the prescription address in Epic [place].    Lamarr CHRISTELLA Dross   [facility] Specialty and Home Delivery Pharmacy Specialty Technician

## 2024-06-01 MED ORDER — PROVIGIL 100 MG TABLET
ORAL_TABLET | Freq: Every day | ORAL | 1 refills | 30.00000 days | Status: CP
Start: 2024-06-01 — End: 2024-07-31

## 2024-06-01 MED ORDER — CHOLECALCIFEROL (VITAMIN D3) 1,250 MCG (50,000 UNIT) CAPSULE
ORAL_CAPSULE | ORAL | 0 refills | 168.00000 days | Status: CP
Start: 2024-06-01 — End: 2024-11-10

## 2024-06-01 NOTE — Unmapped (Signed)
 Received fax stating that Provigil  100 mg script sent by Dr. Dennise will not be covered as written (100 mg x 2), but will be covered if it is written 200 mg.     Please re-send script as recommended it appropriate.     Thank you,  Thersia

## 2024-06-01 NOTE — Unmapped (Signed)
 Addended byBETHA DENNISE ODOR on: 06/01/2024 01:39 PM     Modules accepted: Orders

## 2024-06-04 MED ORDER — MODAFINIL 200 MG TABLET
ORAL_TABLET | Freq: Every day | ORAL | 2 refills | 30.00000 days | Status: CP
Start: 2024-06-04 — End: 2024-09-02

## 2024-07-02 DIAGNOSIS — G35 Multiple sclerosis: Principal | ICD-10-CM

## 2024-07-08 ENCOUNTER — Inpatient Hospital Stay: Admit: 2024-07-08 | Discharge: 2024-07-08 | Payer: Medicaid (Managed Care)

## 2024-07-10 ENCOUNTER — Inpatient Hospital Stay: Admit: 2024-07-10 | Discharge: 2024-07-10

## 2024-07-12 DIAGNOSIS — G35 Multiple sclerosis: Principal | ICD-10-CM

## 2024-07-12 MED ORDER — DULOXETINE 30 MG CAPSULE,DELAYED RELEASE
ORAL_CAPSULE | Freq: Every day | ORAL | 1 refills | 0.00000 days
Start: 2024-07-12 — End: ?

## 2024-07-13 NOTE — Unmapped (Signed)
 Last Visit Date: 04/14/2024  Next Visit Date: 10/16/2024    Lab Results   Component Value Date    JC Virus AB POSITIVE 02/28/2022    Hep B Surface Ag Nonreactive 02/01/2022    Hep B Core Total Ab Nonreactive 02/01/2022    Hepatitis C Ab Nonreactive 02/01/2022    HIV Antigen/Antibody Combo Nonreactive 02/01/2022        No results found for this or any previous visit.      No results found for this or any previous visit.      No results found for this or any previous visit.

## 2024-07-14 MED ORDER — DULOXETINE 30 MG CAPSULE,DELAYED RELEASE
ORAL_CAPSULE | Freq: Every day | ORAL | 1 refills | 90.00000 days | Status: CP
Start: 2024-07-14 — End: ?

## 2024-08-24 DIAGNOSIS — G35 Multiple sclerosis: Principal | ICD-10-CM

## 2024-08-24 MED ORDER — DALFAMPRIDINE ER 10 MG TABLET,EXTENDED RELEASE,12 HR
ORAL_TABLET | Freq: Two times a day (BID) | ORAL | 0 refills | 90.00000 days
Start: 2024-08-24 — End: ?

## 2024-08-24 NOTE — Unmapped (Signed)
 Request received via interface.     Provider: Dr. Rockney    Last Visit Date: 04/14/2024  Next Visit Date: 10/16/2024    Lab Results   Component Value Date    JC Virus AB POSITIVE 02/28/2022    Hep B Surface Ag Nonreactive 02/01/2022    Hep B Core Total Ab Nonreactive 02/01/2022    Hepatitis C Ab Nonreactive 02/01/2022    HIV Antigen/Antibody Combo Nonreactive 02/01/2022        No results found for this or any previous visit.      No results found for this or any previous visit.      No results found for this or any previous visit.

## 2024-08-25 MED ORDER — DALFAMPRIDINE ER 10 MG TABLET,EXTENDED RELEASE,12 HR
ORAL_TABLET | Freq: Two times a day (BID) | ORAL | 0 refills | 90.00000 days | Status: CP
Start: 2024-08-25 — End: ?

## 2024-08-26 DIAGNOSIS — G35 Multiple sclerosis: Principal | ICD-10-CM

## 2024-08-26 MED ORDER — DALFAMPRIDINE ER 10 MG TABLET,EXTENDED RELEASE,12 HR
ORAL_TABLET | Freq: Two times a day (BID) | ORAL | 0 refills | 90.00000 days
Start: 2024-08-26 — End: ?

## 2024-08-27 NOTE — Unmapped (Signed)
 Last Visit Date: 04/14/2024  Next Visit Date: 10/16/2024  Last seen by Dennise  Lab Results   Component Value Date    JC Virus AB POSITIVE 02/28/2022    Hep B Surface Ag Nonreactive 02/01/2022    Hep B Core Total Ab Nonreactive 02/01/2022    Hepatitis C Ab Nonreactive 02/01/2022    HIV Antigen/Antibody Combo Nonreactive 02/01/2022        No results found for this or any previous visit.      No results found for this or any previous visit.      No results found for this or any previous visit.

## 2024-08-28 DIAGNOSIS — G35 Multiple sclerosis: Principal | ICD-10-CM

## 2024-08-28 MED ORDER — DALFAMPRIDINE ER 10 MG TABLET,EXTENDED RELEASE,12 HR
ORAL_TABLET | Freq: Two times a day (BID) | ORAL | 0 refills | 90.00000 days | Status: CP
Start: 2024-08-28 — End: ?

## 2024-08-31 DIAGNOSIS — G35 Multiple sclerosis: Principal | ICD-10-CM

## 2024-08-31 MED ORDER — DALFAMPRIDINE ER 10 MG TABLET,EXTENDED RELEASE,12 HR
ORAL_TABLET | Freq: Two times a day (BID) | ORAL | 1 refills | 90.00000 days | Status: CP
Start: 2024-08-31 — End: ?
  Filled 2024-09-10: qty 180, 90d supply, fill #0

## 2024-08-31 NOTE — Unmapped (Signed)
 Last ordered 08/28/24.  Duplicate request.

## 2024-08-31 NOTE — Unmapped (Signed)
 Request received via interface.     Provider: Therisa Needy, FNP    Last Visit Date: 04/14/2024  Next Visit Date: 10/16/2024    Lab Results   Component Value Date    JC Virus AB POSITIVE 02/28/2022    Hep B Surface Ag Nonreactive 02/01/2022    Hep B Core Total Ab Nonreactive 02/01/2022    Hepatitis C Ab Nonreactive 02/01/2022    HIV Antigen/Antibody Combo Nonreactive 02/01/2022        No results found for this or any previous visit.      No results found for this or any previous visit.      No results found for this or any previous visit.

## 2024-09-07 NOTE — Unmapped (Signed)
 The [facility] has made a second and final attempt to reach this patient to refill the following medication:dalfampridine  10 mg Tb12 .      We have left voicemail with Ruffin  at the following phone numbers: [phone] , have sent a text message to the following phone numbers: [phone] , and have sent a Mychart questionnaire..    Dates contacted: 08/24/24 and  09/07/24   Last scheduled delivery: 06/02/24 - 90 day  supply     The patient may be at risk of non-compliance with this medication. The patient should call the [facility] at [phone]  Option 4, then Option 3: Allergy, Immunology, Pulmonary, Neurology to refill medication.    Tawni Daring   [facility]

## 2024-09-08 NOTE — Unmapped (Signed)
 LVM to reschedule appt >>  *Reschedule 2 weeks after appt in a 9am or 2pm slot*

## 2024-09-08 NOTE — Unmapped (Signed)
 [facility] Refill Coordination Note    Specialty Medication(s) to be Shipped:   Neurology: Dalfampridine     Other medication(s) to be shipped: No additional medications requested for fill at this time    Specialty Medications not needed at this time: N/A     Nathan Yu, DOB: 01/25/1986  Phone: [phone] (home)       All above HIPAA information was verified with patient.     Was a Nurse, learning disability used for this call? No    Completed refill call assessment today to schedule patient's medication shipment from the [facility]  [phone]).  All relevant notes have been reviewed.     Specialty medication(s) and dose(s) confirmed: Regimen is correct and unchanged.   Changes to medications: Nathan Yu reports no changes at this time.  Changes to insurance: No  New side effects reported not previously addressed with a pharmacist or physician: None reported  Questions for the pharmacist: No    Confirmed patient received a Conservation officer, historic buildings and a Surveyor, mining with first shipment. The patient will receive a drug information handout for each medication shipped and additional FDA Medication Guides as required.       DISEASE/MEDICATION-SPECIFIC INFORMATION        N/A    SPECIALTY MEDICATION ADHERENCE     Medication Adherence    Patient reported X missed doses in the last month: 0  Specialty Medication: dalfampridine  10 mg Tb12  Patient is on additional specialty medications: No              Were doses missed due to medication being on hold? No        dalfampridine  10 mg Tb12: 5 days of medicine on hand     REFERRAL TO PHARMACIST     Referral to the pharmacist: Not needed      [facility]     Shipping address confirmed in Epic.     Cost and Payment: Patient has a copay of $4.00. They are aware and have authorized the pharmacy to charge the credit card on file.    Delivery Scheduled: Yes, Expected medication delivery date: 10.3.25.     Medication will be delivered via UPS to the prescription address in Epic WAM.    Nathan Yu   [facility]  Specialty Technician

## 2024-09-08 NOTE — Unmapped (Signed)
 LVM to *Reschedule 2 weeks before or after appt in a 9am or 2pm slot*

## 2024-09-10 ENCOUNTER — Ambulatory Visit: Admit: 2024-09-10 | Discharge: 2024-10-09 | Payer: Worker's Compensation

## 2024-09-10 ENCOUNTER — Ambulatory Visit: Admit: 2024-09-10 | Payer: Medicaid (Managed Care)

## 2024-09-10 NOTE — Unmapped (Signed)
 [facility] THERAP[facility]    OUTPATIENT NEUROLOGIC PHYSICAL THERAPY   EVALUATION         Patient Name: Nathan Yu  Date of Birth:29-Jun-1986  Date: 09/10/2024  Session Number:  1 (1/10 to reassessment)  Therapy Diagnosis:   Encounter Diagnosis   Name Primary?    Multiple sclerosis      Occurrence Codes:  Date of Diagnosis: 2023/12/12  Date of Evaluation: 09/10/2024  Date Treatment Began: 10/02/24  Referring Pracitioner: Rockney Elspeth RAMAN   Certification Dates: [place] [facility] [place]    Requesting authorization of PT x 8 visits ( 10/02/24 -  12/11/24)  Primary Therapist:   Precautions: Fatigue, MS Overexertion, Heat Intolerance     Assessment:   38 y.o. male presents with history of RR MS (Dx in 12-12-23)   resulting in decreased Left LE strength, impaired balance, deconditioning, and core weakness which are limiting the patient's independence, safety, and functional mobility.  During evaluation the patient demonstrates deficits in functional strength (5xSTS: 14.86 sec); self perception of mobility (MSWS-12  72 % impaired ); and  increased fatigue (MFIS 82% ).  The Functional Gait Assessment was utilized to assess postural stability and balance during various walking tasks helping to determine dynamic balance, gait performance, and fall risk.  Nathan Yu's score of 14/30 indicates increased risk of falls showing the most difficulty performing Tandem walk and head turns .     Patient will benefit from skilled Physical Therapy services to address the above noted deficits/impairments as the patient requires: assistance to optimize safety, facilitating independence, verbal cueing, analyzing/modifying performance, manual/physical assistance, inhibition of abnormal/compensatory strategies, establishing a HEP, facilitating function, providing instructions/education, developing compensatory strategies, gait training and environmental modifications.     Next session: Give  Stretches for Spasticity and [name] ,  Review HEP for balance       Communication/consultation with other professionals:  discussed POC with PT     Prognosis:  Good   Positive Indicators: age, motivation, and prior level of function   Negative Indicators: functional strength deficits and impaired balance    Personal Factors/Comorbidities Present: 3 or more personal factors and/or comorbidities that will impact the plan of care for the current problem, including Hypertension as this can effect pt's exercise tolerance as exercise generally increases hypertension. Depression and/or anxiety as this can effect compliance and participation in therapy. Vision impairment , Spasticity    Examination of Body Systems includes 4 or more  body structures, functions, activity limitations and/or participation restrictions including nervous, musculoskeletal, LE, and functional mobility    Clinical Decision Making: High Complexity Eval Code:  Data from the patient???s history indicates 4 personal factors or comorbidities that will affect the treatment of the current problem. Examination of body systems reveals 3 or more body structures, functions, activity limitations and/or participation restrictions and the clinical presentation is evolving. The results of a standardized functional test or outcome measure indicates the clinical decision making was of moderate complexity.    Plan     Freq/Duration: per evaluation (09/10/24) 1 x per week for 8 weeks to address balance related issues and give HEP to manage spasticity and strength.      Planned Interventions: Postural exercises/education  Aquatic therapy  Taping  Education  There Ex   There Act   Manual   Balance  Orthotic Consult as needed   PPTM  Dry needling as appropriate    PT DME/Equipment Recs: Assistive Device for ambulation - likley needs more  support for longer distance ambulation . Discuss trekking poles vs. Rollater next session       Goals    Patient/Family Goals: Improve my  balance     Short Term Goals:    Long Term Goals 8 weeks  Patient will be independent with upgraded HEP and transition to community based wellness program as appropriate.  Patient will ambulate > 350 feet with no AD with symmetrical step length and limited negative compensatory strategies on level and unlevel surfaces .  Patient will go up and down 12 steps with 1 or no  rail and reciprocal pattern with no negative compensatory strategies and RPE 4/10  Patient will perform floor transfers with  no need for  external support and mod independence.  Patient will improve score on FGA to > 28/30 to demonstrate improved mobility  and reduced risk of falls.  Patient will improve score on MFIS  to < 50 %  to demonstrate improved mobility with decreased overall fatigue  Patient will improve score on MS -12  to < 30 % to demonstrate self- reported reduced limitations of walking and thus reduced risk of falls.    Subjective:   Pt states: I feel like I have been in an ongoing exacerbation since last year.  My balance is off and I want to act early so I don't get worse.    Last Clemens:  yesterday - able to get up .  Has near misses multiple times a day          Reason for Referral/History of Present Condition/Onset of injury/exacerbation:   38 y.o. male presents to the [facility] s/pa Multiple Sclerosis diagnosis in Dec 2024 with residual weakness and balance deficits including frequent falls.   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      Social History: Lives with wife  4 kids  in single story home with zero entry  -  one threshold   Caregiver availability, capability, willingness:Ind with ADLS -  Shower is now accessible now with  bar   Independent w/ ADLs?: Not all - Needs assist from wall to balance in shower .      Modified Fatigue Impact Scale (MFIS)    Because of my fatigue during the past 4 weeks???     Score   I have been less alert 0   I have had difficulty paying attention for long periods of time. 0   I have been unable to think clearly 2   I have been clumsy and uncoordinated. 3     I have been forgetful. 4   I have had to pace myself in my physical activities. 4   I have been less motivated  to do anything that requires physical effort. 4   I have been less motivated to participate in social activities. 4   I have been limited in my ability to do things away from home. 4   I have trouble maintaining physical effort for long periods 0   I have had difficulty making decisions. 0   I have been less motivated to do anything that requires thinking. 4   My muscles have felt weak 2   I have been physically uncomfortable 0   I have had trouble finishing tasks that require thinking. 4   I have had difficulty organizing my thoughts when doing things at home or at work. 4   I have been less able to complete tasks that require physical effort 3   My thinking has been slowed down 3   I have had trouble concentrating. 0   I have limited my physical activities 4   I have needed to rest more often or for longer periods 4     Physical Subscale Score:24/36  This scale can range from 0 to 36. It is computed by adding raw scores on the following items: 4+6+7+10+13+14+17+20+21.     Cognitive Subscale Score: 21/40  This scale can range from 0 to 40. It is computed by adding raw scores on the following items: 1+2+3+5+11+12+15+16+18+19.     Psychosocial Subscale Score: 8/8   This scale can range from 0 to 8. It is computed by adding raw scores on the following items: 8+9.     Total MFIS Score: 53/84  The total MFIS score can range from 0 to 84. It is computed by adding scores on the physical, cognitive, and psychosocial subscales.      Patient???s communication preference: Verbal and Written    Barriers to Learning: Fatigue and  poor memory due to MS    Recent Procedures/Tests/Findings  04/14/24 MRI Brain w/o contrast  FINDINGS:     There are multiple foci of T2/FLAIR hyperintensity in the infratentorial, juxtacortical and periventricular white matter which appear unchanged. Some of the lesions demonstrate corresponding T1 black holes consistent with axonal loss. No new lesions.      The optic nerves are normal in appearance. No diffuse brain volume loss. Ventricles are normal in size.     See EMR for full history of procedures/tests/findings.        Objective:   Posture:    Sitting: rounded shoulders and foward head   Standing: wide BOS  Skin assessment/Edema: n/a    Pain: nerve pain   Current:  0/10   Max:  10/10 - 1-2 x every 3 months  - Meds help    Nature of pain: In legs and feet Right worse than left -  Sharp electricity sort of pain .   Pain Frequency: intermittent       Sensation:   In Legs  impaired and absent in left foot.     Proprioception Impaired in bilateral LE      Vision:  Reports poor depth perception and double vision, Wears glasses which help some.  Not formally assessed    Vestibular:    Dizziness has improve since meds.  Had vertigo in past     Range of Motion:   WNL x 4         Strength/MMT:  BUE grossly 5/5      LE MMT   LE MMT Left Right   Hip flex:  (L2) 5/5 3+/5   Hip abd/ER  in seated 4/5 4/5   Hip Ext 3-/5 3-/5   Hip add/IR in seated 4/5 4/5   Knee ext:  (L3) 5/5 5/5   Knee flex: (S2) 5/5 4-/5   Ankle DF:  (L4) 4/5  PF 4 3+/5  PF 3+       Motor Function/Coordination:   Intact [name] in UE,  Impaired in LE   Impaired FTF (slow)    Reflexes/Tone/Spasticity:   Increased tone in bilateral LE 2/4 on MAS    Transfers:   Supine<>Sit: Ind   Sit<>Stand: Ind with no UE support      Balance   Tandem with need for UE support to achieve position   SLS x 3 sec  Bilaterally  with UE support to achieve position   Functional Gait Assessment    Requirements: A marked 6-m (20-ft) walkway that is marked with a 30.48-cm (12-in) width.    1. GAIT LEVEL SURFACE: 1 7.83   Instructions: Walk at your normal speed from here to the next mark (6 m[20 ft]).  Grading: Oneil the highest category that applies.  (3) Normal--Walks 6 m (20 ft) in less than 5.5 seconds, no assistive devices, good speed, no evidence for imbalance, normal gait pattern, deviates no more than 15.24 cm (6 in) outside of the 30.48-cm (12-in) walkway width.  (2) Mild impairment--Walks 6 m (20 ft) in less than 7 seconds but greater than 5.5 seconds, uses assistive device, slower speed, mild gait deviations, or deviates 15.24-25.4 cm (6-10 in) outside of the 30.48-cm (12-in) walkway width.  (1) Moderate impairment--Walks 6 m (20 ft), slow speed, abnormal gait pattern, evidence for imbalance, or deviates 25.4-38.1 cm (10-15 in) outside of the 30.48-cm (12-in) walkway width. Requires more than 7 seconds to ambulate 6 m (20 ft).  (0) Severe impairment--Cannot walk 6 m (20 ft) without assistance, severe gait deviations or imbalance, deviates greater than 38.1 cm (15 in) outside of the 30.48-cm (12-in) walkway width or reaches and touches the wall.    2. CHANGE IN GAIT SPEED: 1  Instructions: Begin walking at your normal pace (for 1.5 m [5 ft]). When I tell you ???go,??? walk as fast as you can (for 1.5 m [5 ft]). When I tell you ???slow,??? walk as slowly as you can (for 1.5 m [5 ft]).  Grading: Oneil the highest category that applies.  (3) Normal--Able to smoothly change walking speed without loss of balance or gait deviation. Shows a significant difference in walking speeds between normal, fast, and slow speeds. Deviates no more than 15.24 cm (6 in) outside of the 30.48-cm (12-in) walkway width.  (2) Mild impairment--Is able to change speed but demonstrates mild gait deviations, deviates 15.24-25.4 cm (6-10 in) outside of the 30.48-cm (12-in) walkway width, or no gait deviations but unable to achieve a significant change in velocity, or uses an assistive device.  (1) Moderate impairment--Makes only minor adjustments to walking speed, or accomplishes a change in speed with significant gait deviations, deviates 25.4-38.1 cm (10-15 in) outside the 30.48-cm (12-in) walkway width, or changes speed but loses balance but is able to recover and continue walking.  (0) Severe impairment--Cannot change speeds, deviates greater than 38.1 cm (15 in) outside 30.48-cm (12-in) walkway width, or loses balance and has to reach for wall or be caught.    3. GAIT WITH HORIZONTAL HEAD TURNS: 1  Instructions: Walk from here to the next mark 6 m (20 ft) away. Begin walking at your normal pace. Keep walking straight; after 3  steps, turn your head to the right and keep walking straight while looking to the right. After 3 more steps, turn your head to the left and keep walking straight while looking left. Continue alternating looking right and left every 3 steps until you have completed 2 repetitions in each direction.  Grading: Oneil the highest category that applies.  (3) Normal--Performs head turns smoothly with no change in gait. Deviates no more than 15.24 cm (6 in) outside 30.48-cm (12-in) walkway width.  (2) Mild impairment--Performs head turns smoothly with slight change in gait velocity (eg, minor disruption to smooth gait path), deviates 15.24-25.4 cm (6-10 in) outside 30.48-cm (12-in) walkway width, or uses an assistive device.  (1) Moderate impairment--Performs head turns with moderate change in gait velocity, slows down, deviates 25.4-38.1 cm (10-15 in) outside 30.48-cm (12-in) walkway width but recovers,  can continue to walk.  (0) Severe impairment--Performs task with severe disruption of gait (eg, staggers 38.1 cm [15 in] outside 30.48-cm (12-in) walkway width, loses balance, stops, or reaches for wall).    4. GAIT WITH VERTICAL HEAD TURNS: 2  Instructions: Walk from here to the next mark (6 m [20 ft]). Begin walking at your normal pace. Keep walking straight; after 3 steps, tip your head up and keep walking straight while looking up. After 3 more steps, tip your head down, keep walking straight while looking down. Continue alternating looking up and down every 3 steps until you have completed 2 repetitions in each direction.  Grading: Oneil the highest category that applies.  (3) Normal--Performs head turns with no change in gait. Deviates no more than 15.24 cm (6 in) outside 30.48-cm (12-in) walkway width.  (2) Mild impairment--Performs task with slight change in gait velocity (eg, minor disruption to smooth gait path), deviates 15.24-25.4 cm (6-10 in) outside 30.48-cm (12-in) walkway width or uses assistive device.  (1) Moderate impairment--Performs task with moderate change in gait velocity, slows down, deviates 25.4-38.1 cm (10-15 in) outside 30.48-cm (12-in) walkway width but recovers, can continue to walk.  (0) Severe impairment--Performs task with severe disruption of gait (eg, staggers 38.1 cm [15 in] outside 30.48-cm (12-in) walkway width, loses balance, stops, reaches for wall).    5. GAIT AND PIVOT TURN: 1  Instructions: Begin with walking at your normal pace. When I tell you, ???turn and stop,??? turn as quickly as you can to face the opposite direction and stop.  Grading: Oneil the highest category that applies.  (3) Normal--Pivot turns safely within 3 seconds and stops quickly with no loss of balance.  (2) Mild impairment--Pivot turns safely in _3 seconds and stops with no loss of balance, or pivot turns safely within 3 seconds and stops with mild imbalance, requires small steps to catch balance.  (1) Moderate impairment--Turns slowly, requires verbal cueing, or requires several small steps to catch balance following turn and stop.  (0)Severe impairment--Cannot turn safely, requires assistance to turn and stop.    6. STEP OVER OBSTACLE: 1  Instructions: Begin walking at your normal speed. When you come to the shoe box, step over it, not around it, and keep walking.  Grading: Oneil the highest category that applies.  (3) Normal--Is able to step over 2 stacked shoe boxes taped together (22.86 cm [9 in] total height) without changing gait speed; no evidence of imbalance.  (2) Mild impairment--Is able to step over one shoe box (11.43 cm [4.5 in] total height) without changing gait speed; no evidence of imbalance.  (1) Moderate impairment--Is able to step  over one shoe box (11.43 cm [4.5 in] total height) but must slow down and adjust steps to clear box safely. May require verbal cueing.  (0) Severe impairment--Cannot perform without assistance.     7. GAIT WITH NARROW BASE OF SUPPORT: 1  Instructions: Walk on the floor with arms folded across the chest, feet aligned heel to toe in tandem for a distance of 3.6 m [12 ft]. The number of steps taken in a straight line are counted for a maximum of 10 steps.  Grading: Oneil the highest category that applies.  (3) Normal--Is able to ambulate for 10 steps heel to toe with no staggering.  (2) Mild impairment--Ambulates 7-9 steps.  (1) Moderate impairment--Ambulates 4-7 steps.  (0) Severe impairment--Ambulates less than 4 steps heel to toe or cannot perform without assistance.    8. GAIT WITH EYES CLOSED: 1  Instructions: Walk at your normal speed from here to the next mark (6 m[20 ft]) with your eyes closed.  Grading: Oneil the highest category that applies.  (3) Normal--Walks 6 m (20 ft), no assistive devices, good speed, no evidence of imbalance, normal gait pattern, deviates no more than 15.24 cm (6 in) outside 30.48-cm (12-in) walkway width. Ambulates 6 m (20 ft) in less than 7 seconds.  (2) Mild impairment--Walks 6 m (20 ft), uses assistive device, slower speed, mild gait deviations, deviates 15.24-25.4 cm (6-10 in) outside 30.48-cm (12-in) walkway width. Ambulates 6 m (20 ft) in less than 9 seconds but greater than 7 seconds.  (1) Moderate impairment--Walks 6 m (20 ft), slow speed, abnormal gait pattern, evidence for imbalance, deviates 25.4-38.1 cm (10-15 in) outside 30.48-cm (12-in) walkway width. Requires more than 9 seconds to ambulate 6 m (20 ft).  (0) Severe impairment--Cannot walk 6 m (20 ft) without assistance, severe gait deviations or imbalance, deviates greater than 38.1 cm (15 in) outside 30.48-cm (12-in) walkway width or will not attempt task.    9. AMBULATING BACKWARDS: 3  Instructions: Walk backwards until I tell you to stop.  Grading: Oneil the highest category that applies.  (3) Normal--Walks 6 m (20 ft), no assistive devices, good speed, no evidence for imbalance, normal gait pattern, deviates no more than 15.24 cm (6 in) outside 30.48-cm (12-in) walkway width.  (2) Mild impairment--Walks 6 m (20 ft), uses assistive device, slower speed, mild gait deviations, deviates 15.24-25.4 cm (6-10 in) outside 30.48-cm (12-in) walkway width.  (1) Moderate impairment--Walks 6 m (20 ft), slow speed, abnormal gait pattern, evidence for imbalance, deviates 25.4-38.1 cm (10-15 in) outside 30.48-cm (12-in) walkway width.  (0) Severe impairment--Cannot walk 6 m (20 ft) without assistance, severe gait deviations or imbalance, deviates greater than 38.1 cm (15 in) outside 30.48-cm (12-in) walkway width or will not attempt task.    10. STEPS: 2  Instructions: Walk up these stairs as you would at home (ie, using the rail if necessary). At the top turn around and walk down.  Grading: Oneil the highest category that applies.  (3) Normal--Alternating feet, no rail.  (2) Mild impairment--Alternating feet, must use rail.  (1) Moderate impairment--Two feet to a stair; must use rail.  (0) Severe impairment--Cannot do safely.    TOTAL SCORE: 14  MAXIMUM SCORE 30    Cutoff score for fall risk: <=22     Original and Validation Literature:    Willye, Diane M., et al. Reliability, internal consistency, and validity of data obtained with the functional gait assessment. Physical therapy 84.10 (2004): [id number].    Additional Literature:  Wrisley, Diane M., and Neeraj A. Von. Functional gait assessment: concurrent, discriminative, and predictive validity in community-dwelling older adults. Physical therapy 90.5 (2010): [id number].    Vannie Glendale CROME., et al. Reference group data for the functional gait assessment. Physical therapy 87.11 (2007): [id number].       Gait Analysis:  Patient ambulates with no AD  x 150 feet with SBA (due to stagger steps), and the following gait deviations:  Decreased heel strike with Patient has intermittent foot drop on right - esp when distracted,   list to right and left with head turns.  Heavy reliance on vision ( looking at feet),  Wide BOS       Stairs: Up and down with increased time due to poor depth perception.  Able to perform without UE support but slow and shaky.  With UE support performs with Mod Ind. X 6 steps.  RPE 5     Functional Tests/Outcome Measures:   Test Eval (09/10/2024)    MSWS-12 44/60 = 73.3%    5xSTS 14.86 sec with no UE support     Gait speed 7.83 sec = 1.27 m/s with        MFIS 53/64 = 82.8 %    FGA 14/30 = fall risk       HEP:   Balance Exercises sent to email  -  [email or web address]   Access Code: [email or web address]  URL: [email or web address]  Date: 09/11/2024  Prepared by: Wilburn Berber    Exercises  - Narrow Stance with Counter Support  - 1 sets - 3 reps - 10-30 sec hold  - Narrow Stance with Head Rotations and Counter Support  - 1 sets - 3 reps  - Narrow Stance with Head Nods and Counter Support  - 1 sets - 3 reps  - Standing Tandem Balance with Counter Support  - 1 sets - 3 reps - 30sec hold  - Standing March with Counter Support  - 1 x daily - 1-2 sets - 10 reps  - Heel Raises with Counter Support  - 1 x daily - 1-2 sets - 10 reps  - Standing Hip Extension with Counter Support  - 1-2 sets - 10 reps    Patient Education: Throughout evaluation patient educated regarding the following: Role of PT in Rehabilitation, HEP, importance of therapy, equipment recommendations, posture, Indications/Contraindications to Exercises, and Fall prevention.  Patient encouraged to use Cane at all times due to poor balance until cleared by PT. Patient demonstrated and verbalized agreement and understanding.        I reviewed the no-show/attendance policy with the patient and caregiver(s). The patient and/or family is aware that they must call to cancel appointments more than 24 hours in advance. They are also aware that if they late cancel (less than 24 hours from appointment), arrives greater than 15 minutes late, or no-show three times, we reserve the right to cancel their remaining appointments. This policy is in place to allow us  to best serve the needs of our caseload.     Total Time: 45 min   PT Evaluation: 45 min (High)       I attest that I have reviewed the above information.  Signed: Wilburn CHRISTELLA Berber, PT, DPT  09/10/2024 1:04 PM

## 2024-09-16 NOTE — Unmapped (Signed)
 Patient called to request a medical statement for provider. Returned call. His attorney will fax over Medical Statement for provider to complete. Confirmed fax number with patient.

## 2024-09-24 NOTE — Unmapped (Signed)
-----   Message from Elspeth Devon, MD sent at 09/24/2024 12:15 PM EDT -----  Walterine can we staff this paitient with an NP provider to follow up skin biopsy (ordered by Dr. Lindle)?      Thanks!

## 2024-10-23 ENCOUNTER — Ambulatory Visit: Admit: 2024-10-23 | Payer: Medicaid (Managed Care)

## 2024-10-23 NOTE — Progress Notes (Unsigned)
 [facility]    OUTPATIENT NEUROLOGIC PHYSICAL THERAPY   Daily Notes       Patient Name: Nathan Yu  Date of Birth:1986-02-24  Date: 10/23/2024  Session Number:  2(2/8 to reassessment)  Therapy Diagnosis:   Encounter Diagnosis   Name Primary?    Multiple sclerosis Yes       Occurrence Codes:  Date of Diagnosis: 12-15-23  Date of Evaluation: 09/10/2024  Date Treatment Began: 10/02/24  Referring Pracitioner: Rockney Elspeth RAMAN   Certification Dates: [place] [facility] BL[facility] [place]    Requesting authorization of PT x 8 visits ( 10/02/24 -  12/11/24)  Primary Therapist:   Precautions: Fatigue, MS Overexertion, Heat Intolerance     Assessment:   38 y.o. male presents with history of RR MS (Dx in 12/15/23)   resulting in increased spasticity, decreased Left LE strength, impaired balance, deconditioning, and core weakness which are limiting the patient's independence, safety, and functional mobility.      Today's session focused on Stretches and [name] for Spasticity reduction,  Review HEP for balance to address goals # 1, 2 and 3. Patient responded well to stretching and plans to use this at home for spasticity management.   Patient will benefit from skilled Physical Therapy services to address the above noted deficits/impairments as the patient requires: assistance to optimize safety, facilitating independence, verbal cueing, analyzing/modifying performance, manual/physical assistance, inhibition of abnormal/compensatory strategies, establishing a HEP, facilitating function, providing instructions/education, developing compensatory strategies, gait training and environmental modifications.     Next session: Review Self stretching  and [name],  Floor transfers ,BERG,  Balance Exercises         Plan     Freq/Duration: per evaluation (09/10/24) 1 x per week for 8 weeks to address balance related issues and give HEP to manage spasticity and strength.      Planned Interventions: Postural exercises/education  Aquatic therapy  Taping  Education  There Ex   There Act   Manual   Balance  Orthotic Consult as needed   PPTM  Dry needling as appropriate    PT DME/Equipment Recs: Assistive Device for ambulation - likley needs more support for longer distance ambulation . Discuss trekking poles vs. Rollater next session       Goals    Patient/Family Goals: Improve my  balance     Short Term Goals:    Long Term Goals 8 weeks  Patient will be independent with upgraded HEP and transition to community based wellness program as appropriate.  Patient will ambulate > 350 feet with no AD with symmetrical step length and limited negative compensatory strategies on level and unlevel surfaces .  Patient will go up and down 12 steps with 1 or no  rail and reciprocal pattern with no negative compensatory strategies and RPE 4/10  Patient will perform floor transfers with  no need for  external support and mod independence.  Patient will improve score on FGA to > 28/30 to demonstrate improved mobility  and reduced risk of falls.  Patient will improve score on MFIS  to < 50 %  to demonstrate improved mobility with decreased overall fatigue  Patient will improve score on MS -12  to < 30 % to demonstrate self- reported reduced limitations of walking and thus reduced risk of falls.    Subjective:   Pt states: I feel like I have been in an ongoing exacerbation since last year.  My balance is off and I want to act  early so I don't get worse.      Reason for Referral/History of Present Condition/Onset of injury/exacerbation:   38 y.o. male presents to the [facility] s/pa Multiple Sclerosis diagnosis in Dec 2024 with residual weakness and balance deficits including frequent falls.   Last Fall: Yesterday  - able to get up slowly  Red Flags:  none      Objective:     Falls - none   Pain Current:  0/10       Self Care : 8 Min     Education on Spasticity management :   Stretching   Rapid Alternating Movement  Prone Lying   Ice/ Heat Medication      TE:  15 min     Access Code: [email or web address]  URL: [email or web address]  Date: 10/23/2024  Prepared by: Wilburn Berber    Exercises  - Supine Double Knee to Chest Modified  - 2 x daily - 7 x weekly - 3 sets - 10 reps - 60 hold  - Supine Lower Trunk Rotation  - 2 x daily - 7 x weekly - 3 sets - 1 reps - 60 sec  hold  - Supine Hamstring Stretch  - 2 x daily - 7 x weekly - 3 sets - 1 reps - 60sec hold  - Circle sit stretch  - 2 x daily - 7 x weekly - 1 sets - 3 reps - 60 sec hold  - Child's Pose Stretch  - 2 x daily - 7 x weekly - 1 sets - 3 reps - 60 sec hold  - Gastroc Stretch on Wall  - 2 x daily - 7 x weekly - 1 sets - 3 reps - 60 sec hold    [name]  for Spasticity reduction - Neural priming  80-90 SPM     Nustep   x 8 min   [name]   Level  85 SPM       NMR -  22 min    Balance Exercises   Narrow Stance with Counter Support  - 1 sets - 3 reps - 10-30 sec hold  - Narrow Stance with Head Rotations and Counter Support  - 1 sets - 3 reps  - Narrow Stance with Head Nods and Counter Support  - 1 sets - 3 reps  - Standing Tandem Balance with Counter Support  - 1 sets - 3 reps - 30sec hold  - Standing March with Counter Support  - 1 x daily - 1-2 sets - 10 reps  - Heel Raises with Counter Support  - 1 x daily - 1-2 sets - 10 reps  - Standing Hip Extension with Counter Support  - 1-2 sets - 10 reps    Standing on Airex   Airex with Head Turns   Airex with EC     Obstacle navigation  Backward Stepping          Functional Tests/Outcome Measures:   Test Eval (10/23/2024)    MSWS-12 44/60 = 73.3%    5xSTS 14.86 sec with no UE support     Gait speed 7.83 sec = 1.27 m/s with        MFIS 53/64 = 82.8 %    FGA 14/30 = fall risk         Patient Education: Throughout  session education provided on methods of spasticity reduction.       Total Time: 45 min   (See Above)       I  attest that I have reviewed the above information.  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      Social History: Lives with wife  4 kids  in single story home with zero entry  -  one threshold   Caregiver availability, capability, willingness:Ind with ADLS -  Shower is now accessible now with  bar   Independent w/ ADLs?: Not all - Needs assist from wall to balance in shower .      Modified Fatigue Impact Scale (MFIS)    Because of my fatigue during the past 4 weeks???     Score   I have been less alert 0   I have had difficulty paying attention for long periods of time. 0   I have been unable to think clearly 2   I have been clumsy and uncoordinated. 3     I have been forgetful. 4   I have had to pace myself in my physical activities. 4   I have been less motivated to do anything that requires physical effort. 4   I have been less motivated to participate in social activities. 4   I have been limited in my ability to do things away from home. 4   I have trouble maintaining physical effort for long periods 0   I have had difficulty making decisions. 0   I have been less motivated to do anything that requires thinking. 4   My muscles have felt weak 2   I have been physically uncomfortable 0   I have had trouble finishing tasks that require thinking. 4   I have had difficulty organizing my thoughts when doing things at home or at work. 4   I have been less able to complete tasks that require physical effort 3   My thinking has been slowed down 3   I have had trouble concentrating. 0   I have limited my physical activities 4   I have needed to rest more often or for longer periods 4     Physical Subscale Score:24/36  This scale can range from 0 to 36. It is computed by adding raw scores on the following items: 4+6+7+10+13+14+17+20+21.     Cognitive Subscale Score: 21/40  This scale can range from 0 to 40. It is computed by adding raw scores on the following items: 1+2+3+5+11+12+15+16+18+19.     Psychosocial Subscale Score: 8/8   This scale can range from 0 to 8. It is computed by adding raw scores on the following items: 8+9.     Total MFIS Score: 53/84  The total MFIS score can range from 0 to 84. It is computed by adding scores on the physical, cognitive, and psychosocial subscales.      Patient???s communication preference: Verbal and Written    Barriers to Learning: Fatigue and  poor memory due to MS    Recent Procedures/Tests/Findings  04/14/24 MRI Brain w/o contrast  FINDINGS:     There are multiple foci of T2/FLAIR hyperintensity in the infratentorial, juxtacortical and periventricular white matter which appear unchanged. Some of the lesions demonstrate corresponding T1 black holes consistent with axonal loss. No new lesions.      The optic nerves are normal in appearance. No diffuse brain volume loss. Ventricles are normal in size.     See EMR for full history of procedures/tests/findings.        Objective:  Falls - none   Pain: nerve pain   Current:  0/10       Education on Spasticity management :     Access Code: [email or web address]  URL: [email or web address]  Date: 10/23/2024  Prepared by: Wilburn Berber    Exercises  - Supine Double Knee to Chest Modified  - 2 x daily - 7 x weekly - 3 sets - 10 reps - 60 hold  - Supine Lower Trunk Rotation  - 2 x daily - 7 x weekly - 3 sets - 1 reps - 60 sec  hold  - Supine Hamstring Stretch  - 2 x daily - 7 x weekly - 3 sets - 1 reps - 60sec hold  - Circle sit stretch  - 2 x daily - 7 x weekly - 1 sets - 3 reps - 60 sec hold  - Child's Pose Stretch  - 2 x daily - 7 x weekly - 1 sets - 3 reps - 60 sec hold  - Gastroc Stretch on Wall  - 2 x daily - 7 x weekly - 1 sets - 3 reps - 60 sec hold    [name]  for Spasticity reduction - Neural priming  80-90 SPM     Nustep   x 8 min   [name]   Level  85 SPM   NMR -   Balance Exercises   Narrow Stance with Counter Support  - 1 sets - 3 reps - 10-30 sec hold  - Narrow Stance with Head Rotations and Counter Support  - 1 sets - 3 reps  - Narrow Stance with Head Nods and Counter Support  - 1 sets - 3 reps  - Standing Tandem Balance with Counter Support  - 1 sets - 3 reps - 30sec hold  - Standing March with Counter Support  - 1 x daily - 1-2 sets - 10 reps  - Heel Raises with Counter Support  - 1 x daily - 1-2 sets - 10 reps  - Standing Hip Extension with Counter Support  - 1-2 sets - 10 reps    Standing on Airex   Airex with Head Turns   Airex with EC     Obstacle navigation  Backward Stepping          Functional Tests/Outcome Measures:   Test Eval (10/23/2024)    MSWS-12 44/60 = 73.3%    5xSTS 14.86 sec with no UE support     Gait speed 7.83 sec = 1.27 m/s with        MFIS 53/64 = 82.8 %    FGA 14/30 = fall risk       HEP:   Balance Exercises sent to email  -  [email or web address]   Access Code: [email or web address]  URL: [email or web address]  Date: 09/11/2024  Prepared by: Wilburn Berber    Exercises  - Narrow Stance with Counter Support  - 1 sets - 3 reps - 10-30 sec hold  - Narrow Stance with Head Rotations and Counter Support  - 1 sets - 3 reps  - Narrow Stance with Head Nods and Counter Support  - 1 sets - 3 reps  - Standing Tandem Balance with Counter Support  - 1 sets - 3 reps - 30sec hold  - Standing March with Counter Support  - 1 x daily - 1-2 sets - 10 reps  - Heel Raises with Counter Support  - 1 x daily - 1-2 sets - 10 reps  - Standing Hip Extension with Counter  Support  - 1-2 sets - 10 reps    Patient Education: Throughout evaluation patient educated regarding the following: Role of PT in Rehabilitation, HEP, importance of therapy, equipment recommendations, posture, Indications/Contraindications to Exercises, and Fall prevention.  Patient encouraged to use Cane at all times due to poor balance until cleared by PT. Patient demonstrated and verbalized agreement and understanding.        I reviewed the no-show/attendance policy with the patient and caregiver(s). The patient and/or family is aware that they must call to cancel appointments more than 24 hours in advance. They are also aware that if they late cancel (less than 24 hours from appointment), arrives greater than 15 minutes late, or no-show three times, we reserve the right to cancel their remaining appointments. This policy is in place to allow us  to best serve the needs of our caseload.     Total Time: 45 min   PT Evaluation: 45 min (High)       I attest that I have reviewed the above information.  Signed: Wilburn CHRISTELLA Berber, PT, DPT  10/23/2024 5:43 AM

## 2024-10-29 NOTE — Progress Notes (Signed)
 [employer] [facility]  Multiple Sclerosis/Neuroimmunology Division  Elspeth Devon, MD  MS/Neuroimmunology Fellow    DATE OF VISIT: 10/30/2024    Re:  Nathan Yu  [address]  [place] [address]  MRN: [id number]  DOB: 12/08/1986    Visit: FU visit, last saw Nathan Yu 04/2024.    REASON FOR VISIT: Nathan Yu, a 38 y.o. Hispanic male, is being seen in consultation at the [facility], Multiple Sclerosis/Neuroimmunology Division for follow up evaluation of RRMS.     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                                                                                                                     ?? RRMS on Ocrevus  since 07/2022  Last dose was administered on 09/2024 - infuses at [facility]     - MS onset: possibly in 2013 with episode of blurry vision and diplopia. In early 2022, had fluctuating paroxsymal episodes and symptoms of gait instability, pain in the left arm, worsening diplopia, and weakness in the legs. Described as intermittent over the course of 1 year. In December 2022, presented hospital and received 5 days of IVMP with some improvement.     - MS diagnosis date: March 2023, diagnosis confirmed after evaluation of mimicking diseases  Meets 2017 McDonald's criteria for DIT with (1) enhancing and non enhancing lesions in typical CNS demyelinating location from review of 11/28/2021 imaging   DIS: periventricular and infratentorial (brainstem/cerebellar peduncle) T2/FLAIR hyperintensities. No spinal cord involvement.     - Disease course at onset: RRMS  - Current disease course: RRMS    - Last MS relapse date: no overt clinical relapse necessitating steroids since 11/2021  - Last steroid treatment: 11/2021      - Imaging studies:  08/01 MRI brain neuroquant wo contrast:  Impression   Multiple white matter lesions in a distribution consistent with multiple sclerosis, unchanged.          - MS DMD History:  Rebif  05/09/2022 - 07/2022. Started with this medication due to quantiferon TB gold+ and 3 months of Rifampin treatment     Ocrevus  induction doses administered on 08/09/2022 and 09/13/2022.     - MS symptomatic treatment:   ^Cymbalta  60 mg daily   ^Provigil  100 - 200 mg daily for fatigue  ^Dalfampridine  10 mg BID to assist with walking speed.    - Supplements:   ^vit D 1000 units QD    ----------------------------------------------------------------------------------    Summary:     Patient is a 38 yo M with RRMS who is on Ocrevus . He denies any new sx concerning for flare. He denies any recent infections. He does report worsening balance but says that  this has only been occurring over the past 6 months. He also reports neuropathic pain in the BLE.     Exam today reveals slower 39 F walk tests, which has worsened progressively over the past 2+ years.     Last imaging included MRI brain neuroquant in 07/2024 and was stable on personal review.    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 Regardless of characterization of MS (RRMS vs SPMS), patient should remain on Ocrevus  given that this medication is HET and it is the only DMT with demonstrated efficacy in progressive MS. Will also start nortryptiline to help with sleep and neuropathic pain. Lastly recommend to continue PT and start OT for imbalance.     We will follow up in 6 months.    Plan:     - Labs today: none    - IMAGING: MRI brain wo contrast, MRI C/T spine wo contrast in 07/2025    - DMT:   Erenest Ocrevus  IV 600 mg q6 months. Last infusion was 39/10/25, next is expected ~03/2025.    - OTHER TREATMENT:  Start nortryptiline 10 mg   Dalfampridine  10 mg BID to assist with walking speed.  Increase Vit D3 to 2000 units daily  Continue PT for imbalance  Referral to OT    Follow up with Dr. Rockney in 6 months     Subjective:     HISTORY OF PRESENT ILLNESS  Nathan Yu is a 38 year old male with a past medical history significant for RRMS, h/o of latent TB from serum testing (s/p treatment), neuropathic pain, HTN, and anxiety/depression who presents to the clinic for follow up evaluation.     Neurologic History:  - Patient was first seen in resident clinic and then evaluated by Dr. Odella Coe as a new patient on 02/01/2022  - Delayed start to DMT due to TB test coming back positive, required 3 months of treatment with Rifampin - was on Rebif    - Started on Ocrevus  in August 2023    .............................................................................................................................................    INTERVAL history since last visit Nathan Yu 04/2024:  - Having a lot of imbalance, this has worsened since last visit.    - PT once a week.    - does not get OT    - graduated ST for cognitive deficits.    - no longer taking cymbalta  (self dc'ed due to difficulties with sleep)    Current symptoms (chief complaints):  Vision/double vision: when looking to the left and right on horizontal end gaze, worse when looking to the left (stable)  Speech, swallowing problems: denies   Fatigue: has felt it before, now more pronounced (ongoing since the last infusion)  Tingling/numbness/pain: Cymbalta  helps and typically does not wake him up in the middle of the night. More pronounced in the mornings.   Balance/coordination problems: has to think more about walking so that he keeps his stability, can randomly feel his R knee give out or a jolt of neuropathic pain   Bowel/bladder control problems:  Memory, mood: having a lot of issues forgetting simple things, word finding difficulty   Gait: does not necessarily feel stable on his feet but not tripping as he once was   Falls: denies recent falls     ............................................................................................................................................Nathan Yu  DIAGNOSTIC STUDIES / REVIEW OF RECORDS:  Prior medical records: reviewed personally, please see assessment    03/16/2023 MRI brain: Multiple foci of T2/FLAIR hyperintensity in the periventricular white matter as well as posterior fossa which appear unchanged. Some of the lesions demonstrate corresponding T1 black holes consistent with axonal loss. No enhancing lesions. The optic nerves are normal in appearance. No diffuse brain volume loss.    10/18/2022 Neuro ophthalmology visit:   - HVF OU: OD inferior scotoma, superior temporal scotoma; OS WNL  - OCT RNFL OU: OD 106; OS 111  - OCT GCC OU: WNL  .............................................................................................................................................      Past Medical History:   Diagnosis Date    Multiple sclerosis 12/06/2021       No Known Allergies        Current Outpatient Medications   Medication Sig Dispense Refill    cholecalciferol , vitamin D3-50 mcg, 2,000 unit,, (VITAMIN D3-50 MCG, 2,000 UNIT,) 50 mcg (2,000 unit) tablet Take 1 tablet (50 mcg total) by mouth daily.      dalfampridine  10 mg Tb12 Take 1 tablet (10 mg total) by mouth every twelve (12) hours. 180 tablet 1    OCREVUS  30 mg/mL injection       nortriptyline  (PAMELOR ) 10 MG capsule Take 1 capsule (10 mg total) by mouth nightly. 30 capsule 2     No current facility-administered medications for this visit.       No past surgical history on file.      Social History     Socioeconomic History    Marital status: Married     Spouse name: None    Number of children: 5    Years of education: None    Highest education level: None Occupational History    Occupation: Naval Architect   Tobacco Use    Smoking status: Former     Current packs/day: 0.00     Average packs/day: 0.4 packs/day for 1.5 years (0.6 ttl pk-yrs)     Types: Cigarettes     Start date: 05/04/2020     Quit date: 11/02/2021     Years since quitting: 2.9     Passive exposure: Never    Smokeless tobacco: Never   Substance and Sexual Activity    Alcohol use: Not Currently    Drug use: Not Currently     Types: Marijuana    Sexual activity: Yes     Partners: Female     Social Drivers of Health     Food Insecurity: Food Insecurity Present (01/30/2023)    Hunger Vital Sign     Worried About Running Out of Food in the Last Year: Sometimes true     Ran Out of Food in the Last Year: Never true   Tobacco Use: Medium Risk (10/30/2024)    Patient History     Smoking Tobacco Use: Former     Smokeless Tobacco Use: Never     Passive Exposure: Never   Transportation Needs: Unmet Transportation Needs (01/30/2023)    PRAPARE - Therapist, Art (Medical): Yes     Lack of Transportation (Non-Medical): Yes   Financial Resource Strain: Low Risk (01/30/2023)    Overall Financial Resource Strain (CARDIA)     Difficulty of Paying Living Expenses: Not hard at all       Family History   Problem Relation Age of Onset    Cancer Mother     Hypertension Sister     Aneurysm Cousin  Review of Systems:  Otherwise negative unless stated in HPI/assessment     Objective:     Physical Exam:  Blood pressure 168/114, pulse 74, temperature 36.3 ??C (97.4 ??F), temperature source Temporal, height 167.6 cm (5' 6), weight (!) 104.8 kg (231 lb).     General Appearance: well appearing.     NEUROLOGICAL EXAMINATION:     General:  Alert and oriented to self  Speech is fluent. No dysarthria.   No obvious word finding difficulty during interview.     PHQ9: 7, in the last 2 weeks the patient does not report having thoughts that she would be better off dead or hurting herself in someway    Cranial Nerves:   II, III- PERRLA, No visual field defect.   III, IV, VI- slow saccades, left end gaze horizontal nystagmus and binocular diplopia; right partial INO.   V- sensation of the face intact b/l.   VII- face symmetrical, no facial droop, buries lashes and puffs out cheeks against resistance   VIII- Hearing grossly intact.   IX and X- symmetric palate contraction, uvula is midline  XI- Full shoulder shrug bilaterally  XII- tongue protrudes midline    Motor Exam:   No spasticity     Muscles UEs  LEs    R L  R L   Deltoids 5/5 5/5 Hip flexors  4+/5 5/5   Biceps 5/5 5/5 Hip extensors 5/5 5/5   Triceps 5/5 5/5 Knee flexors 5/5 5/5   Hand grip 5/5 5/5      Wrist flexors  5/5 5/5 Foot dorsal flexors 5/5 5/5   Wrist extensors 5/5 5-/5 Foot plantar flexors 5/5 5/5      Reflexes R L   Biceps +2 +2   Brachioradialis  +2 +2   Patella +2 +2     Sensory system:  Superficial light touch sensation: WNL     Cerebellar/Coordination:  Normal finger to nose with no end point dysmetria or dystaxia b/l  Normal heel to shin b/l.     Romberg + with eye closure, unable to perform tandem gait     Gait: wide based with walk with cane    T25FW  10/30/2024 assisted walk with cane: 7.20 seconds  04/14/24 unassisted walk: 6.38 seconds  04/02/23 unassisted walk: 4.66 seconds     .........................................................................................................................................Nathan Yu    VISIT SUMMARY:  Nathan Yu, a 38 y.o. 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 All questions were answered.     Encounter billed based on complexity    Case was discussed with attending physician, Dr. Lesta.    Thank you for the opportunity to contribute to the care of Mr. Nathan Yu.

## 2024-10-30 ENCOUNTER — Ambulatory Visit
Admit: 2024-10-30 | Discharge: 2024-10-31 | Payer: Medicaid (Managed Care) | Attending: Student in an Organized Health Care Education/Training Program | Primary: Student in an Organized Health Care Education/Training Program

## 2024-10-30 DIAGNOSIS — G35A Relapsing remitting multiple sclerosis: Principal | ICD-10-CM

## 2024-10-30 MED ORDER — NORTRIPTYLINE 10 MG CAPSULE
ORAL_CAPSULE | Freq: Every evening | ORAL | 2 refills | 30.00000 days | Status: CP
Start: 2024-10-30 — End: 2025-01-28
  Filled 2024-12-15: qty 30, 30d supply, fill #0

## 2024-10-30 NOTE — Progress Notes (Signed)
 [facility] REHAB THERAPIES    OUTPATIENT NEUROLOGIC PHYSICAL THERAPY   Daily Notes       Patient Name: Nathan Yu  Date of Birth:04-08-1986  Date: 10/30/2024  Session Number:  3(3/8 to reassessment)  Therapy Diagnosis:   Encounter Diagnosis   Name Primary?    Multiple sclerosis Yes         Occurrence Codes:  Date of Diagnosis: Nov 14, 2023  Date of Evaluation: 09/10/2024  Date Treatment Began: 10/02/24  Referring Pracitioner: Nathan Yu   Certification Dates: [place] MGD CAID HEALTHY BLUE [place]    Requesting authorization of PT x 8 visits ( 10/02/24 -  12/11/24)  Primary Therapist:   Precautions: Fatigue, MS Overexertion, Heat Intolerance     Assessment:   38 y.o. 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      Today's session focused on floor transfers, balance and dual tasking, as well as,  visual scanning to reduce tripping. The Berg Balance Scale was used today to identify falls risk and monitor progress in balance rehabilitation. Nathan Yu's score of 54/56 indicates decreased  falls risk, particularly during dynamic balance tasks such as 360 degree turns. .  This risk can impact independence and safety, especially with dual-tasking or in unfamiliar environments.  These limit day to day safety during community mobility and increase risk during ADLs requiring dynamic postural control. Patient will benefit from skilled Physical Therapy services to address the above noted deficits/impairments as the patient requires: assistance to optimize safety, facilitating independence, verbal cueing, analyzing/modifying performance, manual/physical assistance, inhibition of abnormal/compensatory strategies, establishing a HEP, facilitating function, providing instructions/education, developing compensatory strategies, gait training and environmental modifications.     Next session:  High level balance exercises, fall prevention,  Dual tasking.     Plan     Freq/Duration: per evaluation (09/10/24) 1 x per week for 8 weeks to address balance related issues and give HEP to manage spasticity and strength.      Planned Interventions: Postural exercises/education  Aquatic therapy  Taping  Education  There Ex   There Act   Manual   Balance  Orthotic Consult as needed   PPTM  Dry needling as appropriate    PT DME/Equipment Recs: Assistive Device for ambulation - likley needs more support for longer distance ambulation . Discuss trekking poles vs. Rollater next session       Goals    Patient/Family Goals: Improve my  balance     Short Term Goals:    Long Term Goals 8 weeks  Patient will be independent with upgraded HEP and transition to community based wellness program as appropriate.  Patient will ambulate > 350 feet with no AD with symmetrical step length and limited negative compensatory strategies on level and unlevel surfaces .  Patient will go up and down 12 steps with 1 or no  rail and reciprocal pattern with no negative compensatory strategies and RPE 4/10  Patient will perform floor transfers with  no need for  external support and mod independence.  Patient will improve score on FGA to > 28/30 to demonstrate improved mobility  and reduced risk of falls.  Patient will improve score on MFIS  to < 50 %  to demonstrate improved mobility with decreased overall fatigue  Patient will improve score on MS -12  to <  30 % to demonstrate self- reported reduced limitations of walking and thus reduced risk of falls.    Subjective:   Pt states: I feel like my  spasticity has gotten better since I have been stretching.        Reason for Referral/History of Present Condition/Onset of injury/exacerbation:   38 y.o. male presents to the [facility] s/pa Multiple Sclerosis diagnosis in Dec 2024 with residual weakness and balance deficits including frequent falls.   Last Fall: Yesterday  - able to get up slowly  Red Flags:  none      Objective:     Falls -  [name] on Wednesday - tripped on something by the counter .  Needed to have wife help me up.   I don't drag my feet, but I feel like I cut corners.  I don't see well out of my left eye.       Pain Current:  3/10  Nerve pain     TA: 15 min     Standing Lunges  to floor  with mat and bilateral support     1/2 knee > tall Kneel > 1/2 kneel > return to stance.      Repeated x 10 reps.        Floor transfers  with unilateral external support       PPTM:  15 min   Berg Balance Scale    SITTING TO STANDING (4)  INSTRUCTIONS: Please stand up. Try not to use your hand for support.  ( ) 4 able to stand without using hands and stabilize independently  ( ) 3 able to stand independently using hands  ( ) 2 able to stand using hands after several tries  ( ) 1 needs minimal aid to stand or stabilize  ( ) 0 needs moderate or maximal assist to stand    STANDING UNSUPPORTED (4)  INSTRUCTIONS: Please stand for two minutes without holding on.  ( ) 4 able to stand safely for 2 minutes  ( ) 3 able to stand 2 minutes with supervision  ( ) 2 able to stand 30 seconds unsupported  ( ) 1 needs several tries to stand 30 seconds unsupported  ( ) 0 unable to stand 30 seconds unsupported  If a subject is able to stand 2 minutes unsupported, score full points for sitting unsupported. Proceed to item #4.    SITTING WITH BACK UNSUPPORTED BUT FEET SUPPORTED ON FLOOR OR ON A STOOL (4)  INSTRUCTIONS: Please sit with arms folded for 2 minutes.  ( ) 4 able to sit safely and securely for 2 minutes  ( ) 3 able to sit 2 minutes under supervision  ( ) 2 able to able to sit 30 seconds  ( ) 1 able to sit 10 seconds  ( ) 0 unable to sit without support 10 seconds    STANDING TO SITTING (4)  INSTRUCTIONS: Please sit down.  ( ) 4 sits safely with minimal use of hands  ( ) 3 controls descent by using hands  ( ) 2 uses back of legs against chair to control descent  ( ) 1 sits independently but has uncontrolled descent  ( ) 0 needs assist to sit    TRANSFERS (4)  INSTRUCTIONS: Arrange chair(s) for pivot transfer. Ask subject to transfer one way toward a seat with armrests and one way  toward a seat without armrests. You may use two chairs (one with and one without armrests) or a bed  and a chair.  ( ) 4 able to transfer safely with minor use of hands  ( ) 3 able to transfer safely definite need of hands  ( ) 2 able to transfer with verbal cuing and/or supervision  ( ) 1 needs one person to assist  ( ) 0 needs two people to assist or supervise to be safe    STANDING UNSUPPORTED WITH EYES CLOSED (4)  INSTRUCTIONS: Please close your eyes and stand still for 10 seconds.  ( ) 4 able to stand 10 seconds safely  ( ) 3 able to stand 10 seconds with supervision  ( ) 2 able to stand 3 seconds  ( ) 1 unable to keep eyes closed 3 seconds but stays safely  ( ) 0 needs help to keep from falling    STANDING UNSUPPORTED WITH FEET TOGETHER (4)  INSTRUCTIONS: Place your feet together and stand without holding on.  ( ) 4 able to place feet together independently and stand 1 minute safely  ( ) 3 able to place feet together independently and stand 1 minute with supervision  ( ) 2 able to place feet together independently but unable to hold for 30 seconds  ( ) 1 needs help to attain position but able to stand 15 seconds feet together  ( ) 0 needs help to attain position and unable to hold for 15 seconds    REACHING FORWARD WITH OUTSTRETCHED ARM WHILE STANDING (4)  INSTRUCTIONS: Lift arm to 90 degrees. Stretch out your fingers and reach forward as far as you can. (Examiner places a ruler at  the end of fingertips when arm is at 90 degrees. Fingers should not touch the ruler while reaching forward. The recorded measure is  the distance forward that the fingers reach while the subject is in the most forward lean position. When possible, ask subject to use  both arms when reaching to avoid rotation of the trunk.)  ( ) 4 can reach forward confidently 25 cm (10 inches)  ( ) 3 can reach forward 12 cm (5 inches)  ( ) 2 can reach forward 5 cm (2 inches)  ( ) 1 reaches forward but needs supervision  ( ) 0 loses balance while trying/requires external support    PICK UP OBJECT FROM THE FLOOR FROM A STANDING POSITION (4)  INSTRUCTIONS: Pick up the shoe/slipper, which is in front of your feet.  ( ) 4 able to pick up slipper safely and easily  ( ) 3 able to pick up slipper but needs supervision  ( ) 2 unable to pick up but reaches 2-5 cm(1-2 inches) from slipper and keeps balance independently  ( ) 1 unable to pick up and needs supervision while trying  ( ) 0 unable to try/needs assist to keep from losing balance or falling    TURNING TO LOOK BEHIND OVER LEFT AND RIGHT SHOULDERS WHILE STANDING (4)  INSTRUCTIONS: Turn to look directly behind you over toward the left shoulder. Repeat to the right. (Examiner may pick an object  to look at directly behind the subject to encourage a better twist turn.)  ( ) 4 looks behind from both sides and weight shifts well  ( ) 3 looks behind one side only other side shows less weight shift  ( ) 2 turns sideways only but maintains balance  ( ) 1 needs supervision when turning  ( ) 0 needs assist to keep from losing balance or falling    TURN 360 DEGREES (  2)  INSTRUCTIONS: Turn completely around in a full circle. Pause. Then turn a full circle in the other direction.  ( ) 4 able to turn 360 degrees safely in 4 seconds or less  ( ) 3 able to turn 360 degrees safely one side only 4 seconds or less  ( ) 2 able to turn 360 degrees safely but slowly  ( ) 1 needs close supervision or verbal cuing  ( ) 0 needs assistance while turning    PLACE ALTERNATE FOOT ON STEP OR STOOL WHILE STANDING UNSUPPORTED (4)  INSTRUCTIONS: Place each foot alternately on the step/stool. Continue until each foot has touched the step/stool four times.  ( ) 4 able to stand independently and safely and complete 8 steps in 20 seconds  ( ) 3 able to stand independently and complete 8 steps in > 20 seconds  ( ) 2 able to complete 4 steps without aid with supervision  ( ) 1 able to complete > 2 steps needs minimal assist  ( ) 0 needs assistance to keep from falling/unable to try    STANDING UNSUPPORTED ONE FOOT IN FRONT (4)  INSTRUCTIONS: (DEMONSTRATE TO SUBJECT) Place one foot directly in front of the other. If you feel that you cannot place  your foot directly in front, try to step far enough ahead that the heel of your forward foot is ahead of the toes of the other foot. (To  score 3 points, the length of the step should exceed the length of the other foot and the width of the stance should approximate the  subject???s normal stride width.)  ( ) 4 able to place foot tandem independently and hold 30 seconds  ( ) 3 able to place foot ahead independently and hold 30 seconds  ( ) 2 able to take small step independently and hold 30 seconds  ( ) 1 needs help to step but can hold 15 seconds  ( ) 0 loses balance while stepping or standing    STANDING ON ONE LEG (4)  INSTRUCTIONS: Stand on one leg as long as you can without holding on.  ( ) 4 able to lift leg independently and hold > 10 seconds  ( ) 3 able to lift leg independently and hold 5-10 seconds  ( ) 2 able to lift leg independently and hold L 3 seconds  ( ) 1 tries to lift leg unable to hold 3 seconds but remains standing independently.  ( ) 0 unable to try of needs assist to prevent fall    (54) TOTAL SCORE (Maximum = 56)  Interpretation:   41-56 = low fall risk  21-40 = medium fall risk  0 -20 = high fall risk  A change of 8 points is required to reveal a genuine change in function between 2 assessments.     NMR: 15 min     Obstacle navigation - hurdles , Forward backward and side stepping     Backward Stepping  no AD.      Ambulation whilte navigating obstacle son left with no AD.       Tandem Stance x 30 sec  with SBA       Dual Tasking   Tandem gait forward - while reciting alphabet backward  Tandem Gait backward - Counting  forward     Tandem walking with head turns to recall letter and word that starts with that letter          Functional Tests/Outcome Measures:  Test Eval (10/30/2024) 10/30/24   MSWS-12 44/60 = 73.3%    5xSTS 14.86 sec with no UE support     Gait speed 7.83 sec = 1.27 m/s with        MFIS 53/64 = 82.8 %    FGA 14/30 = fall risk     BERG  54/56       Patient Education: Throughout  session education provided on methods of spasticity reduction.       Total Time: 45 min   (See Above)       I attest that I have reviewed the above information.  Signed: Wilburn CHRISTELLA Berber, PT, DPT  10/30/2024 10:09 AM

## 2024-10-30 NOTE — Patient Instructions (Signed)
 Thank you for visiting the [facility] MS/Neuroimmunology Clinic today!  Please see below our contact information:      In case of:  a suspected relapse (new symptoms or worsening existing symptoms, lasting for >24h)  OR  a need for an additional appointment for other reasons  OR   if you have other questions: please contact:      [facility] Neurology [facility] Desk  Phone: [phone]     If you have questions for our clinical pharmacist Corean Bailer, please call: [phone]    If you would have questions outside regular office hours, please call [facility] hospital operator:   Phone: [phone], and ask for a neurology resident on-call.               Dr. Elspeth Devon  MS/Neuroimmunology Fellow  [facility], Department of Neurology  Multiple Sclerosis/Neuroimmunology Division    The [facility] MS/Neuroimmunology Clinic is a specialty clinic and there is a need for you to have a  primary care provider  who will take care of your non-neurological health.   ....................................................................................................    Our recommendations from today's visit:     - Labs today: none     - IMAGING: MRI brain wo contrast, MRI C/T spine wo contrast in 07/2025     - DMT:   Erenest Ocrevus  IV 600 mg q6 months. Last infusion was 09/18/24, next is expected ~03/2025.     - OTHER TREATMENT:  Start nortryptiline 10 mg   Dalfampridine  10 mg BID to assist with walking speed.  Increase Vit D3 to 2000 units daily  Continue PT for imbalance  Referral to OT     Follow up with Dr. Devon in 6 months   ........................................................................................Nathan Yu    It was a pleasure to see you today!    We are grateful for the opportunity to contribute to your care and are looking forward to seeing you again.  Please if you can take time to complete a post-visit survey so we can identify opportunities for improvement. We also like to know when we do things well so we can continue providing great service.

## 2024-11-27 ENCOUNTER — Ambulatory Visit: Admit: 2024-11-27 | Payer: Medicaid (Managed Care)

## 2024-11-27 NOTE — Progress Notes (Signed)
 [facility]  [address] [place], [place] [address]    [phone]    Nathan Yu cancelled his scheduled Physical Therapy follow-up session within 24 hours due to not feeling well. .       SignedBETHA Wilburn CHRISTELLA Nyle, PT  11/27/2024 8:57 AM

## 2024-12-14 NOTE — Progress Notes (Signed)
 [facility] Delivery [facility] Clinical Assessment & Refill Coordination Note    Nathan Yu [place], DOB: August 27, 1986  Phone: [phone] (home)     All above HIPAA information was verified with patient.     Was a nurse, learning disability used for this call? No    Specialty Medication(s):   Neurology: Dalfampridine      Current Medications[1]     Changes to medications: Tyde reports no changes at this time.    Medication list has been reviewed and updated in Epic: Yes    Allergies[2]    Changes to allergies: No    Allergies have been reviewed and updated in Epic: Yes    SPECIALTY MEDICATION ADHERENCE     dalfampridine  10 mg: 0 doses of medicine on hand       Specialty medication is an injection or given on a cycle: No    Medication Adherence    Patient reported X missed doses in the last month: 0  Specialty Medication: dalfampridine   Patient is on additional specialty medications: No  Informant: patient          Specialty medication(s) dose(s) confirmed: Regimen is correct and unchanged.     Are there any concerns with adherence? No    Adherence counseling provided? Not needed    CLINICAL MANAGEMENT AND INTERVENTION      Clinical Benefit Assessment:    Do you feel the medicine is effective or helping your condition? Yes    Clinical Benefit counseling provided? Not needed    Adverse Effects Assessment:    Are you experiencing any side effects? No    Are you experiencing difficulty administering your medicine? No    Quality of Life Assessment:    Quality of Life    Rheumatology  Oncology  Dermatology  Cystic Fibrosis          How many days over the past month did your MS  keep you from your normal activities? For example, brushing your teeth or getting up in the morning. Patient declined to answer    Have you discussed this with your provider? Not needed    Acute Infection Status:    Acute infections noted within Epic:  No active infections    Patient reported infection: None    Therapy Appropriateness:    Is the medication and dose appropriate considering the patient???s diagnosis, treatment, and disease journey, comorbidities, medical history, current medications, allergies, therapeutic goals, self-administration ability, and access barriers? Yes, therapy is appropriate and should be continued     Clinical Intervention:    Was an intervention completed as part of this clinical assessment? No    DISEASE/MEDICATION-SPECIFIC INFORMATION      N/A    Multiple Sclerosis: Have you experienced any flares in the last month? No  Has this been reported to your provider? Not applicable  What was the outcome of the flare? Not applicable    PATIENT SPECIFIC NEEDS     Does the patient have any physical, cognitive, or cultural barriers? No    Is the patient high risk? No    Does the patient require physician intervention or other additional services (i.e., nutrition, smoking cessation, social work)? No    Does the patient have an additional or emergency contact listed in their chart? Yes    SOCIAL DETERMINANTS OF HEALTH     At the [facility], we have learned that life circumstances - like trouble affording food, housing, utilities, or transportation can affect the health of many  of our patients.   That is why we wanted to ask: are you currently experiencing any life circumstances that are negatively impacting your health and/or quality of life? Patient declined to answer    Social Drivers of Health     Food Insecurity: Food Insecurity Present (01/30/2023)    Hunger Vital Sign     Worried About Running Out of Food in the Last Year: Sometimes true     Ran Out of Food in the Last Year: Never true   Tobacco Use: Medium Risk (10/30/2024)    Patient History     Smoking Tobacco Use: Former     Smokeless Tobacco Use: Never     Passive Exposure: Never   Transportation Needs: Unmet Transportation Needs (01/30/2023)    PRAPARE - Therapist, Art (Medical): Yes     Lack of Transportation (Non-Medical): Yes   Alcohol Use: Not on file   Housing: Not on file   Physical Activity: Not on file   Utilities: Not on file   Stress: Not on file   Interpersonal Safety: Not on file   Substance Use: Not on file (10/20/2023)   Intimate Partner Violence: Not on file   Social Connections: Not on file   Financial Resource Strain: Low Risk (01/30/2023)    Overall Financial Resource Strain (CARDIA)     Difficulty of Paying Living Expenses: Not hard at all   Health Literacy: Not on file   Internet Connectivity: Not on file       Would you be willing to receive help with any of the needs that you have identified today? Not applicable       SHIPPING     Specialty Medication(s) to be Shipped:   Neurology: Dalfampridine     Other medication(s) to be shipped: nortriptyline     Specialty Medications not needed at this time: N/A     Changes to insurance: No    Cost and Payment: Patient has a copay of $4. They are aware and have authorized the pharmacy to charge the credit card on file.    Delivery Scheduled: Yes, Expected medication delivery date: 12/16/24.     Medication will be delivered via UPS to the confirmed prescription address in [facility].    The patient will receive a drug information handout for each medication shipped and additional FDA Medication Guides as required.  Verified that patient has previously received a Conservation Officer, Historic Buildings and a Surveyor, Mining.    The patient or caregiver noted above participated in the development of this care plan and knows that they can request review of or adjustments to the care plan at any time.      All of the patient's questions and concerns have been addressed.    Vernell VEAR Gull, PharmD   [facility] Specialty Pharmacist       [1]   Current Outpatient Medications   Medication Sig Dispense Refill    cholecalciferol , vitamin D3-50 mcg, 2,000 unit,, (VITAMIN D3-50 MCG, 2,000 UNIT,) 50 mcg (2,000 unit) tablet Take 1 tablet (50 mcg total) by mouth daily.      dalfampridine  10 mg Tb12 Take 1 tablet (10 mg total) by mouth every twelve (12) hours. 180 tablet 1    nortriptyline  (PAMELOR ) 10 MG capsule Take 1 capsule (10 mg total) by mouth nightly. 30 capsule 2    OCREVUS  30 mg/mL injection        No current facility-administered medications for  this visit.   [2] No Known Allergies

## 2024-12-15 MED FILL — DALFAMPRIDINE ER 10 MG TABLET,EXTENDED RELEASE,12 HR: ORAL | 90 days supply | Qty: 180 | Fill #1
